# Patient Record
Sex: Female | Born: 1965
Health system: Southern US, Community
[De-identification: ages and names within clinical notes are randomized; demographics above are authoritative.]

## PROBLEM LIST (undated history)

## (undated) DIAGNOSIS — E039 Hypothyroidism, unspecified: Secondary | ICD-10-CM

## (undated) DIAGNOSIS — E119 Type 2 diabetes mellitus without complications: Secondary | ICD-10-CM

## (undated) DIAGNOSIS — E079 Disorder of thyroid, unspecified: Secondary | ICD-10-CM

## (undated) DIAGNOSIS — E785 Hyperlipidemia, unspecified: Secondary | ICD-10-CM

## (undated) DIAGNOSIS — I10 Essential (primary) hypertension: Secondary | ICD-10-CM

## (undated) HISTORY — DX: Type 2 diabetes mellitus without complications: E11.9

## (undated) HISTORY — DX: Hyperlipidemia, unspecified: E78.5

## (undated) HISTORY — DX: Hypothyroidism, unspecified: E03.9

## (undated) HISTORY — PX: ABDOMINAL HYSTERECTOMY: SHX81

## (undated) HISTORY — PX: PLANTAR FASCIA SURGERY: SHX746

## (undated) HISTORY — DX: Essential (primary) hypertension: I10

## (undated) HISTORY — DX: Disorder of thyroid, unspecified: E07.9

---

## 2001-09-21 ENCOUNTER — Other Ambulatory Visit: Admission: RE | Admit: 2001-09-21 | Discharge: 2001-09-21 | Payer: Self-pay | Admitting: Family Medicine

## 2003-09-14 ENCOUNTER — Ambulatory Visit (HOSPITAL_COMMUNITY): Admission: RE | Admit: 2003-09-14 | Discharge: 2003-09-14 | Payer: Self-pay | Admitting: Family Medicine

## 2006-06-19 ENCOUNTER — Ambulatory Visit: Payer: Self-pay | Admitting: Cardiology

## 2006-06-24 ENCOUNTER — Ambulatory Visit: Payer: Self-pay | Admitting: Cardiology

## 2006-06-25 ENCOUNTER — Ambulatory Visit: Payer: Self-pay | Admitting: Cardiovascular Disease

## 2006-06-25 ENCOUNTER — Ambulatory Visit (HOSPITAL_COMMUNITY): Admission: RE | Admit: 2006-06-25 | Discharge: 2006-06-25 | Payer: Self-pay | Admitting: Cardiology

## 2006-08-18 ENCOUNTER — Encounter: Admission: RE | Admit: 2006-08-18 | Discharge: 2006-08-18 | Payer: Self-pay | Admitting: Family Medicine

## 2006-08-26 ENCOUNTER — Ambulatory Visit (HOSPITAL_BASED_OUTPATIENT_CLINIC_OR_DEPARTMENT_OTHER): Admission: RE | Admit: 2006-08-26 | Discharge: 2006-08-26 | Payer: Self-pay | Admitting: Orthopedic Surgery

## 2008-10-17 ENCOUNTER — Ambulatory Visit: Payer: Self-pay | Admitting: Cardiovascular Disease

## 2008-10-17 ENCOUNTER — Ambulatory Visit: Payer: Self-pay

## 2008-11-08 ENCOUNTER — Encounter: Admission: RE | Admit: 2008-11-08 | Discharge: 2009-02-06 | Payer: Self-pay | Admitting: Family Medicine

## 2008-12-22 ENCOUNTER — Ambulatory Visit (HOSPITAL_COMMUNITY): Admission: RE | Admit: 2008-12-22 | Discharge: 2008-12-22 | Payer: Self-pay | Admitting: Surgery

## 2008-12-26 ENCOUNTER — Ambulatory Visit (HOSPITAL_COMMUNITY): Admission: RE | Admit: 2008-12-26 | Discharge: 2008-12-26 | Payer: Self-pay | Admitting: Surgery

## 2009-01-03 ENCOUNTER — Encounter: Admission: RE | Admit: 2009-01-03 | Discharge: 2009-04-03 | Payer: Self-pay | Admitting: Surgery

## 2009-02-13 ENCOUNTER — Ambulatory Visit (HOSPITAL_COMMUNITY): Admission: RE | Admit: 2009-02-13 | Discharge: 2009-02-13 | Payer: Self-pay | Admitting: Surgery

## 2009-02-13 HISTORY — PX: LAPAROSCOPIC GASTRIC BANDING: SHX1100

## 2009-03-01 ENCOUNTER — Encounter: Payer: Self-pay | Admitting: Cardiovascular Disease

## 2009-04-10 ENCOUNTER — Encounter: Admission: RE | Admit: 2009-04-10 | Discharge: 2009-07-09 | Payer: Self-pay | Admitting: Surgery

## 2009-08-09 ENCOUNTER — Encounter: Admission: RE | Admit: 2009-08-09 | Discharge: 2009-10-03 | Payer: Self-pay | Admitting: Surgery

## 2010-06-07 ENCOUNTER — Encounter: Admission: RE | Admit: 2010-06-07 | Discharge: 2010-06-07 | Payer: Self-pay | Admitting: Surgery

## 2011-01-14 LAB — COMPREHENSIVE METABOLIC PANEL
Albumin: 4.1 g/dL (ref 3.5–5.2)
BUN: 15 mg/dL (ref 6–23)
CO2: 26 mEq/L (ref 19–32)
Calcium: 9.6 mg/dL (ref 8.4–10.5)
Chloride: 105 mEq/L (ref 96–112)
Creatinine, Ser: 0.67 mg/dL (ref 0.4–1.2)
GFR calc Af Amer: >60 mL/min (ref >60)
Glucose, Bld: 104 mg/dL — ABNORMAL HIGH (ref 70–99)
Potassium: 4.3 mEq/L (ref 3.5–5.1)
Sodium: 139 mEq/L (ref 135–145)
Total Bilirubin: 0.6 mg/dL (ref 0.3–1.2)
Total Protein: 7.1 g/dL (ref 6.0–8.3)

## 2011-01-14 LAB — DIFFERENTIAL
Basophils Absolute: 0 10*3/uL (ref 0.0–0.1)
Basophils Relative: 1 % (ref 0–1)
Eosinophils Absolute: 0 10*3/uL (ref 0.0–0.7)
Eosinophils Relative: 0 % (ref 0–5)
Lymphocytes Relative: 36 % (ref 12–46)
Monocytes Absolute: 0.2 10*3/uL (ref 0.1–1.0)
Neutrophils Relative %: 59 % (ref 43–77)

## 2011-01-14 LAB — CBC
MCV: 81.7 fL (ref 78.0–100.0)
RDW: 13.9 % (ref 11.5–15.5)

## 2011-02-18 NOTE — Procedures (Signed)
[facility]                              EXERCISE TREADMILL   MAHASIN, RIVIERE                 MRN:          [id number]  DATE:10/17/2008                            DOB:          [birth year]    Excess stress test.   INDICATIONS:  Pre-exercise program.  The patient excised 5 minutes on  standard Bruce protocol.  Exercise was stopped due to dyspnea and  fatigue.  Maximum heart rate was 134.  The patient did take her  Lopressor prior to the study.  Maximum blood pressure was 208/86.   Resting EKG was normal with stress.  There was no ischemia or  arrhythmia.   IMPRESSION:  Negative exercise stress test at a reasonable workload.  Heart rate response somewhat suboptimal due to beta-blockade.   This would indicate that the patient is free to pursue an exercise  program without limitation.     Noralyn Pick. Eden Emms, MD, [facility]  Electronically Signed    PCN/MedQ  DD: 10/17/2008  DT: 10/18/2008  Job #: [id number]   cc:   Lillia Carmel, M.D.

## 2011-02-18 NOTE — Op Note (Signed)
Melissa Wyatt, Melissa Wyatt          ACCOUNT NO.:  [id number]   MEDICAL RECORD NO.:  [id number]          PATIENT TYPE:  AMB   LOCATION:  DAY                          FACILITY:  [facility]   PHYSICIAN:  Sandria Bales. Ezzard Standing, M.D.  DATE OF BIRTH:  [birth year]   DATE OF PROCEDURE:  02/13/2009  DATE OF DISCHARGE:                               OPERATIVE REPORT   Date of Surgery - 13 Feb 2009   PREOPERATIVE DIAGNOSIS:  Morbid obesity (weight 273), body mass index  50.4.   POSTOPERATIVE DIAGNOSIS:  Morbid obesity (weight 273), body mass index  50.4.   PROCEDURE:  Placement of an AP large laparoscopic band, port placed  subcutaneous.   SURGEON:  Sandria Bales. Ezzard Standing, MD.   FIRST ASSISTANT:  Wenda Low, MD.   ANESTHESIA:  General endotracheal with approximately 30 mL of 1%  Xylocaine.   COMPLICATIONS:  None.   INDICATIONS FOR PROCEDURE:  Melissa Wyatt is a 45 year old white female  who is a patient of Dr. Lavada Mesi and sees Dr. Charlton Haws from a  cardiac standpoint, has been through our preoperative bariatric program.  She is interested in having a lap-band for weight control.  I discussed  with her the indications and potential complications of lap-band  surgery.   Potential complications include, but are not limited to, bleeding,  infection, bowel injury, slippage of the band, erosion of the band and  long-term nutritional consequences.   OPERATIVE NOTE:  The patient placed in the supine position, given a  general endotracheal anesthetic.   She was prepped with DuraPrep and sterilely draped.  She was given 2  grams of Ancef at initiation of procedure.  A time-out was held  identifying the patient and the procedure.   I accessed the abdominal cavity in the left upper quadrant using an 11-  mm Optiview.  The right and left lobes of the liver were both yellow due  to fatty infiltration.  The stomach that I could see was unremarkable.  She did have some adhesions towards her lower  abdomen where the omentum  was.  This covered most of her bowel but I saw no other mass or nodule.   I then placed 4 additional trocars, I placed a 5-mm trocar in the  subxiphoid location for the liver retractor, a 15-mm right upper  quadrant trocar, an 11-mm right paramedian trocar, an 11-mm left  paramedian trocar.   I placed the [facility] liver retractor on the left lobe of the liver and  was able to elevate the liver.  I got to the hiatus and identified the  gastroesophageal junction.   First, I passed the sizing tube down into the stomach, blew up 15 mL  there, pulled this back.  This snugged against the hiatus and there was  no evidence of a hiatal hernia.   I then made an incision on the angle of His to the left of the  gastroesophageal junction and dissected along the left crus.   I then made an opening into the gastrohepatic ligament at the end of the  right crus, found where the fat seemed to cross the  right crus behind  the stomach, made an incision here and passed the finger dissector  behind the gastroesophageal junction up to the angle of His.   Because of the patient's size and BMI, she did have a moderate amount of  fat around her gastroesophageal junction.  I used an AP large band and  passed this into the stomach.  I passed the tubing into the finger  dissector, pulled the tubing around the gastroesophageal junction and  then readvanced the sizing tube.  I cinched the large band down on the  sizing tubing and there was adequate room, the sizing tube was then  removed and, actually, there as a little bit of space around the AP  large band around the gastroesophageal junction, so it actually looked a  little looser than they normally expected.   I then used the 0 Ethibond suture and grabbed stomach along the greater  curvature and imbricated this over the band to the stomach above the  band.  I placed 3 sutures, a 0 Ethibond suture, it imbricated the  stomach over  the band and held the band in place.   I then reinspected the band, photos were taken that will be placed in  the chart showing the band position.  I then pulled the tubing out  through the right paramedian incision.  The [facility] liver retractor  was removed, each trocar was removed in turn, there was no bleeding at  any trocar site.  I decided, because she is thick enough, to place the  port subcutaneously, so I made a pocket lateral to the incision and  placed the port there.  I did have a backing of polypropylene behind the  port and then oversewed this with a 2-0 Vicryl suture.  I then closed  each port site with a 5-0 Vicryl suture, painted the wounds with  tincture of benzoin and Steri-Strips.   The patient tolerated the procedure well, went to the recovery room in  good condition.  Sponge and needle count were correct.  The patient  possibly will go home today.      Sandria Bales. Ezzard Standing, M.D.  Electronically Signed     DHN/MEDQ  D:  02/13/2009  T:  02/13/2009  Job:  [id number]   cc:   Lillia Carmel, M.D.  Fax: [phone]   Noralyn Pick. Eden Emms, MD, [facility]  [address]  [address]  [place]  [place] [address]

## 2011-02-21 NOTE — Assessment & Plan Note (Signed)
[facility]                              CARDIOLOGY OFFICE NOTE   Melissa Wyatt, Melissa Wyatt                 MRN:          [id number]  DATE:06/19/2006                            DOB:          [birth year]    REFERRING PHYSICIAN:  Lillia Wyatt, M.D.   REASON FOR REFERRAL:  Evaluation of chest pain.   CLINICAL HISTORY:  Melissa Wyatt is 45 years old, and has been seen by me a  couple of years ago for evaluation of chest pain.  At that time we did a  stress echo, which did not show any evidence of ischemia.  Recently she has  had recurrent chest pain in the last 2 weeks that she describes as  substernal pressure.  She relates this mostly to stress.  She has not had  much in the way of exertional symptoms.  She has occasional palpitations,  and has had no major symptoms of shortness of breath.   PAST MEDICAL HISTORY:  Significant for hypertension, and Melissa Wyatt has been  treating her for this, and she is currently on metoprolol.  She also has a  history of treated hypothyroidism.   CURRENT MEDICATIONS:  1. Metoprolol 100 mg in the morning, 50 mg at noon and 100 mg at night.  2. She is also on Synthroid.   FAMILY HISTORY:  Positive in her mother.  Her father had bypass surgery, and  now has lung cancer.  Her mother also had bypass surgery, and now has  cerebral aneurysms that have been treated with coils.  Her mother is a  patient of mine.   SOCIAL HISTORY:  She is married, does not smoke, has one child.  She used to  work in Clinical biochemist, and now she is working in Airline pilot, I believe, and  she has some increased stress related to this.   REVIEW OF SYSTEMS:  Positive for headaches.   PHYSICAL EXAMINATION:  The blood pressure is 180/95, the pulse is 60 and  regular.  There was no venous distention.  The carotid pulses are full and there were  no bruits.  CHEST:  Clear without rales or rhonchi.  CARDIAC:  Rhythm was regular.  The first and  second heart sounds were  normal.  There were no murmurs or gallops.  ABDOMEN:  Soft but protuberant.  There is no organomegaly. The bowel sounds  were normal.  Peripheral pulses are full, and there is no peripheral edema.  MUSCULOSKELETAL SYSTEM:  Showed no deformity.  SKIN:  Warm and dry.  NEUROLOGIC:  Examination showed no focal neurological signs.   IMPRESSION:  1. Chest pain, atypical for ischemia.  2. Hypertension, not under good control.  3. History of hyperlipidemia.  4. Marked positive family history for coronary heart disease.   RECOMMENDATIONS:  Melissa Wyatt has a very strong risk profile, but her  symptoms are somewhat atypical, so I think her risk is low to intermediate.  She is not a very good candidate for a stress test because of the  probability of false positive test, and I think maybe the best way to  evaluate  her would be with a CT angiogram.  We have arranged for her to have  this done over at [facility]  She knows Dr. Fabio Wyatt twin brother quite  well, and I have told her Dr. Eden Wyatt would be involved in her study.  We  will also get a repeat fasting lipid profile, BNP and CBC, and we will start  her on Norvasc 10 mg a day.  She also can follow up with her blood pressure  response.   ADDENDUM:  Melissa Wyatt mother's name is Melissa Wyatt, who is a patient  of mine.  Her boss is best friends with Melissa Wyatt's brother, and Melissa Arista  Wyatt's brother was in her boss's wedding recently.                               Melissa Elvera Lennox Juanda Chance, MD, [facility]    BRB/MedQ  DD:  06/19/2006  DT:  06/21/2006  Job #:  [id number]

## 2011-02-21 NOTE — Letter (Signed)
July 01, 2006    Lillia Carmel, M.D.  [address]  [place], [place]  [address]   RE:  Melissa Wyatt, Melissa Wyatt  MRN:  [id number]  /  DOB:  [birth year]   Dear Kathlene November:   We recently did a cardiac CT angiogram on [facility] and her results  were quite good.  The coronary arteries were free of significant obstruction  and there was no calcium in the coronary arteries.  However, her blood tests  were abnormal with an elevated fasting glucose of 115 and a cholesterol of  219 with triglycerides of 178 and HDL of 38 and LDL of 147.  She also has  hypertension and has a positive family history for coronary heart disease.  Her last weight here was 262 pounds.   She indicated to me that she had been quite successful under your guidance  in loosing weight in the past and would prefer to try and loose some weight  as opposed to starting on anticholesterol treatment.  I think the diet and  weight loss are the most important thing that she can do and I supported  that.  She plans to be in touch with you about following through on her  weight reduction program.   Her CT angio showed a lung nodule and the radiologist recommended a followup  CT in 12 months.  We will plan to take care of arranging that followup.  Otherwise, I will plan to see her back on a p.r.n. basis.  I appreciate the  opportunity to help with her.   Incl:  Copy CT angio, laboratory   Sincerely,     Bruce R. Juanda Chance, MD, [facility]   BRB/MedQ  DD:  07/01/2006 DT:  07/03/2006 Job #:  [id number]

## 2011-06-05 ENCOUNTER — Telehealth (INDEPENDENT_AMBULATORY_CARE_PROVIDER_SITE_OTHER): Payer: Self-pay

## 2011-06-05 NOTE — Telephone Encounter (Signed)
Pt called and states for about 2 weeks at night she is experiencing reflux of liquid up into her throat and coughing.  She is 2 years s/p lap band placement.  She last eats or drinks at 9pm and goes to bed at 10 or 11pm.  I told her it would be worth trying to stop eating or drinking at 7pm and that may help but that her band is probably too tight.  She has already made an appointment for tomorrow with Mardelle Matte.

## 2011-06-06 ENCOUNTER — Encounter (INDEPENDENT_AMBULATORY_CARE_PROVIDER_SITE_OTHER): Payer: Self-pay

## 2011-07-03 ENCOUNTER — Encounter (INDEPENDENT_AMBULATORY_CARE_PROVIDER_SITE_OTHER): Payer: Self-pay

## 2011-07-04 ENCOUNTER — Ambulatory Visit (INDEPENDENT_AMBULATORY_CARE_PROVIDER_SITE_OTHER): Payer: Commercial Managed Care - PPO | Admitting: Physician Assistant

## 2011-07-04 ENCOUNTER — Encounter (INDEPENDENT_AMBULATORY_CARE_PROVIDER_SITE_OTHER): Payer: Self-pay | Admitting: Physician Assistant

## 2011-07-04 VITALS — BP 126/82 | HR 56 | Resp 14 | Ht 62.0 in | Wt 185.6 lb

## 2011-07-04 DIAGNOSIS — Z4651 Encounter for fitting and adjustment of gastric lap band: Secondary | ICD-10-CM

## 2011-07-04 NOTE — Progress Notes (Signed)
  HISTORY: Melissa Wyatt is a 45 y.o.female who received an AP-Large lap-band in May 2010 by Dr. Ezzard Wyatt. She comes in having last been seen in Nov 2011 and has lost a remarkable 50 lbs. Unfortunately over the past month she's begun having progressive GERD symptoms to the point that now she's not capable of taking solids or liquids. She's been taking omeprazole with little help.  VITAL SIGNS: Filed Vitals:   07/04/11 1311  BP: 126/82  Pulse: 56  Resp: 14    PHYSICAL EXAM: Physical exam reveals a very well-appearing 45 y.o.female in no apparent distress Neurologic: Awake, alert, oriented Psych: Bright affect, conversant Respiratory: Breathing even and unlabored. No stridor or wheezing Abdomen: Soft, nontender, nondistended to palpation. Incisions well-healed. No incisional hernias. Port easily palpated. Extremities: Atraumatic, good range of motion.  ASSESMENT: 45 y.o.  female  s/p AP-Large lap-band.   PLAN: [name] port was accessed with a 20G Huber needle without difficulty. Clear fluid was aspirated and 2.5 mL saline was removed from the port to give a total predicted volume of 7.5 mL. She felt no further globus sensation and was able to tolerate water with ease. I'd like her to return in two weeks to re-establish some degree of restriction without obstruction. I've asked her to take omeprazole 20 mg bid for one week followed by qday for the following week.

## 2011-07-04 NOTE — Patient Instructions (Signed)
Take omeprazole 20mg  twice a day for one week followed by once a day for one week. Return in two weeks.

## 2011-07-23 ENCOUNTER — Encounter (INDEPENDENT_AMBULATORY_CARE_PROVIDER_SITE_OTHER): Payer: Commercial Managed Care - PPO | Admitting: General Surgery

## 2011-08-01 ENCOUNTER — Encounter (INDEPENDENT_AMBULATORY_CARE_PROVIDER_SITE_OTHER): Payer: Commercial Managed Care - PPO

## 2011-08-08 ENCOUNTER — Ambulatory Visit (INDEPENDENT_AMBULATORY_CARE_PROVIDER_SITE_OTHER): Payer: Commercial Managed Care - PPO | Admitting: Physician Assistant

## 2011-08-08 ENCOUNTER — Encounter (INDEPENDENT_AMBULATORY_CARE_PROVIDER_SITE_OTHER): Payer: Self-pay

## 2011-08-08 VITALS — BP 132/86 | HR 60 | Temp 98.2°F | Resp 18 | Ht 62.0 in | Wt 197.2 lb

## 2011-08-08 DIAGNOSIS — Z4651 Encounter for fitting and adjustment of gastric lap band: Secondary | ICD-10-CM

## 2011-08-08 NOTE — Progress Notes (Signed)
  HISTORY: Melissa Wyatt is a 45 y.o.female who received an AP-Large lap-band in May 2010 by Dr. Ezzard Standing. She was last seen one month ago for persistent reflux. I removed 2 mL from her band and since then has gained 12 lbs. Her symptoms have resolved. She would like to get an upward adjustment today.  VITAL SIGNS: Filed Vitals:   08/08/11 0931  BP: 132/86  Pulse: 60  Temp: 98.2 F (36.8 C)  Resp: 18    PHYSICAL EXAM: Physical exam reveals a very well-appearing 45 y.o.female in no apparent distress Neurologic: Awake, alert, oriented Psych: Bright affect, conversant Respiratory: Breathing even and unlabored. No stridor or wheezing Abdomen: Soft, nontender, nondistended to palpation. Incisions well-healed. No incisional hernias. Port easily palpated. Extremities: Atraumatic, good range of motion.  ASSESMENT: 45 y.o.  female  s/p AP-Large lap-band.   PLAN: [name] port was accessed with a 20G Huber needle without difficulty. Clear fluid was aspirated and 2 mL saline was added to the port to give a total predicted volume of 9.5 mL which is just shy of her previous fill volume of 10 mL. The patient was able to swallow water without difficulty following the procedure and was instructed to take clear liquids for the next 24-48 hours and advance slowly as tolerated.

## 2011-08-08 NOTE — Patient Instructions (Signed)
Take clear liquids for the next 48 hours. Thin protein shakes are ok to start on Saturday evening. Call us if you have persistent vomiting or regurgitation, night cough or reflux symptoms. Return as scheduled or sooner if you notice no changes in hunger/portion sizes.   

## 2011-11-06 ENCOUNTER — Encounter (INDEPENDENT_AMBULATORY_CARE_PROVIDER_SITE_OTHER): Payer: Self-pay | Admitting: General Surgery

## 2011-12-04 ENCOUNTER — Ambulatory Visit (INDEPENDENT_AMBULATORY_CARE_PROVIDER_SITE_OTHER): Payer: Commercial Managed Care - PPO | Admitting: Physician Assistant

## 2011-12-04 ENCOUNTER — Encounter (INDEPENDENT_AMBULATORY_CARE_PROVIDER_SITE_OTHER): Payer: Self-pay

## 2011-12-04 VITALS — BP 146/88 | HR 70 | Temp 97.8°F | Resp 18 | Ht 62.0 in | Wt 195.4 lb

## 2011-12-04 DIAGNOSIS — Z4651 Encounter for fitting and adjustment of gastric lap band: Secondary | ICD-10-CM

## 2011-12-04 NOTE — Progress Notes (Signed)
  HISTORY: Melissa Wyatt is a 46 y.o.female who received an AP-Large lap-band in May 2010 by Dr. Ezzard Standing. She comes in today complaining of reflux symptoms in the morning. She's not able to take her medicines without regurgitation and discomfort. Evenings are usually fine.  VITAL SIGNS: Filed Vitals:   12/04/11 0956  BP: 146/88  Pulse: 70  Temp: 97.8 F (36.6 C)  Resp: 18    PHYSICAL EXAM: Physical exam reveals a very well-appearing 46 y.o.female in no apparent distress Neurologic: Awake, alert, oriented Psych: Bright affect, conversant Respiratory: Breathing even and unlabored. No stridor or wheezing Abdomen: Soft, nontender, nondistended to palpation. Incisions well-healed. No incisional hernias. Port easily palpated. Extremities: Atraumatic, good range of motion.  ASSESMENT: 46 y.o.  female  s/p AP-Large lap-band.   PLAN: [name] port was accessed with a 20G Huber needle without difficulty. Clear fluid was aspirated and 0.5 mL saline was removed from the port to give a total predicted volume of 9 mL.

## 2011-12-04 NOTE — Patient Instructions (Signed)
Return in 3 months or sooner if needed.

## 2012-02-26 ENCOUNTER — Ambulatory Visit (INDEPENDENT_AMBULATORY_CARE_PROVIDER_SITE_OTHER): Payer: Commercial Managed Care - PPO | Admitting: Surgery

## 2012-02-26 ENCOUNTER — Ambulatory Visit (INDEPENDENT_AMBULATORY_CARE_PROVIDER_SITE_OTHER): Payer: Commercial Managed Care - PPO | Admitting: Physician Assistant

## 2012-02-26 ENCOUNTER — Encounter (INDEPENDENT_AMBULATORY_CARE_PROVIDER_SITE_OTHER): Payer: Self-pay

## 2012-02-26 VITALS — BP 144/88 | HR 68 | Temp 96.9°F | Resp 14 | Ht 62.0 in | Wt 206.0 lb

## 2012-02-26 DIAGNOSIS — Z9884 Bariatric surgery status: Secondary | ICD-10-CM | POA: Insufficient documentation

## 2012-02-26 DIAGNOSIS — Z4651 Encounter for fitting and adjustment of gastric lap band: Secondary | ICD-10-CM

## 2012-02-26 NOTE — Progress Notes (Signed)
  HISTORY: Melissa Wyatt is a 46 y.o.female who received an AP-Large lap-band in May 2010 by Dr. Ezzard Standing. She comes in with increased hunger and portion sizes since removal of fluid about three months ago. She has no further issues with reflux. She said she would like to try the same fill volume again with greater attention to eating habits.  VITAL SIGNS: Filed Vitals:   02/26/12 0852  BP: 144/88  Pulse: 68  Temp: 96.9 F (36.1 C)  Resp: 14    PHYSICAL EXAM: Physical exam reveals a very well-appearing 46 y.o.female in no apparent distress Neurologic: Awake, alert, oriented Psych: Bright affect, conversant Respiratory: Breathing even and unlabored. No stridor or wheezing Abdomen: Soft, nontender, nondistended to palpation. Incisions well-healed. No incisional hernias. Port easily palpated. Extremities: Atraumatic, good range of motion.  ASSESMENT: 46 y.o.  female  s/p AP-Large lap-band.   PLAN: [name] port was accessed with a 20G Huber needle without difficulty. Clear fluid was aspirated and 0.5 mL saline was added to the port to give a total predicted volume of 9.5 mL. The patient was able to swallow water without difficulty following the procedure and was instructed to take clear liquids for the next 24-48 hours and advance slowly as tolerated.

## 2012-02-26 NOTE — Patient Instructions (Signed)
Take clear liquids tonight. Thin protein shakes are ok to start tomorrow morning. Slowly advance your diet thereafter. Call us if you have persistent vomiting or regurgitation, night cough or reflux symptoms. Return as scheduled or sooner if you notice no changes in hunger/portion sizes.  

## 2012-02-26 NOTE — Progress Notes (Signed)
The patient saw Mardelle Matte earlier today who added 0.5 cc to her lap band.  The calculated amount in the lap band was 9.5 cc.  But she got sick within one hour of leaving the office and comes back because the lap band is too tight.  I withdrew all the fluid and only measured 8.0 cc.  I removed 0.5 cc and the patient was able to tolerate water.  She will see [place] back in about 6 months.  Ovidio Kin, MD, [facility] Pager: [phone] Office phone:  [phone]

## 2012-04-02 ENCOUNTER — Telehealth (INDEPENDENT_AMBULATORY_CARE_PROVIDER_SITE_OTHER): Payer: Self-pay

## 2012-04-02 NOTE — Telephone Encounter (Signed)
I called the pt back because I got a message from Dr Ezzard Standing and he said she could see Dr Biagio Quint or him.  I told her I would have the bariatric coordinator involved with the process and plan to make an appointment with Dr Biagio Quint since he does the surgery.  I will have them call her.

## 2012-04-02 NOTE — Telephone Encounter (Signed)
The patient called regarding her lap band and wanting to switch to a sleeve.  She has lost about 80 lbs and was told her band had the max amount of fluid in it.  She vomits occasionally and is nauseated frequently.  She thinks the sleeve will be better for her and she won't have to deal with those symptoms.  I told her Dr Ezzard Standing doesn't do that kind of surgery but there is another surgeon here that does.  I said I would ask if Dr Ezzard Standing wants to see her and discuss her band more or if she should go ahead and see the other surgeon now.  She feels like we have done all we can with the band.

## 2012-04-29 ENCOUNTER — Ambulatory Visit (INDEPENDENT_AMBULATORY_CARE_PROVIDER_SITE_OTHER): Payer: Commercial Managed Care - PPO | Admitting: General Surgery

## 2012-04-29 ENCOUNTER — Encounter (INDEPENDENT_AMBULATORY_CARE_PROVIDER_SITE_OTHER): Payer: Self-pay | Admitting: General Surgery

## 2012-04-29 VITALS — BP 122/78 | HR 68 | Temp 97.1°F | Resp 16 | Ht 62.0 in | Wt 208.2 lb

## 2012-04-29 DIAGNOSIS — R1310 Dysphagia, unspecified: Secondary | ICD-10-CM

## 2012-04-29 NOTE — Progress Notes (Signed)
Patient ID: Melissa Wyatt, female   DOB: 1966-08-16, 46 y.o.   MRN: [id number]  Chief Complaint  Patient presents with  . Weight Loss Surgery    Discuss conversion from Lap Band to Gastric Sleeve    HPI Melissa Wyatt is a 46 y.o. female.  This patient is presents for evaluation and discussion of possible conversion of a lap band to  sleeve gastrectomy. She had a LAP-BAND placement by Dr. Ezzard Standing in May of 2010 and has lost about 70 pounds since her surgery. However, over the last few months she feels that she has been tight and her last adjustment was actually some removal of 1.5 cc of fluid. She says that even after removal she still has reflux and regurgitation. She vomits about 4 days of the week. She says that she takes in a small amount of food in her exercise is limited. She says that she does number about once per week and some golf but otherwise does not have dedicated exercise time. She was interested in a sleeve gastrectomy for additional weight loss. However, now that I talked to her and that she has read our consent form and she realizes that this procedure is not reversible and that the stomach is removed, she is no longer interested in the procedure. HPI  Past Medical History  Diagnosis Date  . Hypertension   . Thyroid disease     Past Surgical History  Procedure Date  . Laparoscopic gastric banding 02/13/09  . Abdominal hysterectomy   . Plantar fascia surgery     Family History  Problem Relation Age of Onset  . Lung cancer Father     Social History History  Substance Use Topics  . Smoking status: Never Smoker   . Smokeless tobacco: Never Used  . Alcohol Use: No    Allergies  Allergen Reactions  . Vicodin (Hydrocodone-Acetaminophen) Nausea And Vomiting and Other (See Comments)    Makes patient break out into a cold sweat.    Current Outpatient Prescriptions  Medication Sig Dispense Refill  . amLODipine (NORVASC) 5 MG tablet Take 5 mg by mouth daily.         Marland Kitchen levothyroxine (SYNTHROID, LEVOTHROID) 125 MCG tablet Take 125 mcg by mouth daily.        . metoprolol (TOPROL-XL) 100 MG 24 hr tablet Take 100 mg by mouth 2 (two) times daily.         Review of Systems Review of Systems  Blood pressure 122/78, pulse 68, temperature 97.1 F (36.2 C), temperature source Temporal, resp. rate 16, height 5\' 2"  (1.575 m), weight 208 lb 3.2 oz (94.439 kg).  Physical Exam Physical Exam  Data Reviewed   Assessment    Morbid obesity and vomiting and reflux Initially she was interested in conversion of her lap band to a sleeve gastrectomy, but she does not seem interested in this anymore after realizing that this is not a reversible procedure. I am not sure that she is quite ready for this anyway. I'm not sure that we have really maximized the use of her band. It sounds as though she is tight with her frequent regurgitation and reflux and I have recommended an upper GI for further evaluation. I would consider removal of some additional fluids and give her a holiday and then slowly increase her fluid in her bands and work her back up to where she is losing weight. We did discuss that this is extra risk since this would be a revisional procedure and  again she is not really interested in this after all.  We will check an upper GI and she will follow up with Dr. Ezzard Standing for continued band maintenance. . Was spent counseling this patient    Plan    Upper GI and followup in about 2-3 weeks with Dr. Ezzard Standing for band maintenance       [name] DAVID 04/29/2012, 4:44 PM

## 2012-05-12 ENCOUNTER — Other Ambulatory Visit: Payer: Self-pay

## 2012-05-21 ENCOUNTER — Encounter (INDEPENDENT_AMBULATORY_CARE_PROVIDER_SITE_OTHER): Payer: Commercial Managed Care - PPO | Admitting: Surgery

## 2013-03-17 ENCOUNTER — Encounter (INDEPENDENT_AMBULATORY_CARE_PROVIDER_SITE_OTHER): Payer: Self-pay

## 2013-03-17 ENCOUNTER — Ambulatory Visit (INDEPENDENT_AMBULATORY_CARE_PROVIDER_SITE_OTHER): Payer: Commercial Managed Care - PPO | Admitting: Physician Assistant

## 2013-03-17 ENCOUNTER — Telehealth (INDEPENDENT_AMBULATORY_CARE_PROVIDER_SITE_OTHER): Payer: Self-pay

## 2013-03-17 VITALS — BP 142/102 | HR 88 | Temp 97.1°F | Resp 14 | Ht 62.0 in | Wt 240.0 lb

## 2013-03-17 DIAGNOSIS — Z4651 Encounter for fitting and adjustment of gastric lap band: Secondary | ICD-10-CM

## 2013-03-17 NOTE — Patient Instructions (Signed)
Take clear liquids tonight. Thin protein shakes are ok to start tomorrow morning. Slowly advance your diet thereafter. Call us if you have persistent vomiting or regurgitation, night cough or reflux symptoms. Return as scheduled or sooner if you notice no changes in hunger/portion sizes.  

## 2013-03-17 NOTE — Telephone Encounter (Signed)
Appointment with Dr. Ezzard Standing 03/31/13@430  Patient aware

## 2013-03-17 NOTE — Progress Notes (Signed)
  HISTORY: Melissa Wyatt is a 47 y.o.female who received an AP-Large lap-band in May 2010 by Dr. Ezzard Standing. She comes in with 31 lbs weight gain since her last visit 11 months ago. She had seen Dr. Ezzard Standing in May 2013 for removal of about 0.5 mL after a fill due to dysphagia. She then saw Dr. Biagio Quint for consideration of sleeve gastrectomy but decided against the procedure. She complains of progressive increase in hunger, portion size and weight but no issues with regurgitation. She would like a fill today to get the weight back off. She is having trouble with hypertension as well, and is seeing her PCP today for this as well as bilateral pedal edema.  VITAL SIGNS: Filed Vitals:   03/17/13 0857  BP: 142/102  Pulse: 88  Temp: 97.1 F (36.2 C)  Resp: 14    PHYSICAL EXAM: Physical exam reveals a very well-appearing 47 y.o.female in no apparent distress Neurologic: Awake, alert, oriented Psych: Bright affect, conversant Respiratory: Breathing even and unlabored. No stridor or wheezing Abdomen: Soft, nontender, nondistended to palpation. Incisions well-healed. No incisional hernias. Port easily palpated. Extremities: Atraumatic, good range of motion. Bilateral pedal edema  ASSESMENT: 47 y.o.  female  s/p AP-Large lap-band.   PLAN: [name] port was accessed with a 20G Huber needle without difficulty. Only 5 mL clear fluid was aspirated, when at least 7.5 mL was predicted. I repositioned the needle to assure good access but there was not change in fluid volume. 1 mL saline was added to the port to give a total predicted volume of 6 mL. The patient was able to swallow water without difficulty following the procedure and was instructed to take clear liquids for the next 24-48 hours and advance slowly as tolerated. I'm concerned for slow leak which is uncommon with this series of band. I've asked her to see Dr. Ezzard Standing next for re-evaluation. She voiced understanding and agreement.

## 2013-03-31 ENCOUNTER — Ambulatory Visit (INDEPENDENT_AMBULATORY_CARE_PROVIDER_SITE_OTHER): Payer: Commercial Managed Care - PPO | Admitting: Surgery

## 2013-03-31 VITALS — BP 132/78 | HR 68 | Temp 98.0°F | Resp 18 | Ht 62.0 in | Wt 243.0 lb

## 2013-03-31 DIAGNOSIS — Z9884 Bariatric surgery status: Secondary | ICD-10-CM

## 2013-03-31 NOTE — Progress Notes (Signed)
[facility]  Ovidio Kin, MD,  FACS [address]  [address] [place], [place]    [address] Phone:  [phone] FAX:  [phone]   Re:   Melissa Wyatt DOB:   12-27-1965 MRN:   [id number]  ASSESSMENT AND PLAN: 1.  Lap band, AP large  Placed by Melissa Wyatt - 02/13/2009  Initial weight - 276, BMI - 50.8  I filled her lap band to a total of 7.5 cc.  Melissa Wyatt had raised the question of a leak.  We will see how she does with this amount.  Her follow up will be in 3 months. 2.  Hypertension 3.  Hypothyroidism 4.  Hypercholsterolemia.  HISTORY OF PRESENT ILLNESS: Chief Complaint  Patient presents with  . Lap Band Fill    ? leak   Melissa Wyatt is a 47 y.o. (DOB: 1965/11/22)  white  female who is a patient of Melissa J, MD and comes to me today for lap band evaluation.  She saw Melissa Wyatt 04/29/2012 for consideration of converting from a lap band to a sleeve gastrectomy, but decided against it.  She did not come back for follow up until 03/17/2013, when she saw Melissa Wyatt who added fluid. He raised the concern about a "slow leak".  In the intervening year (2013 to 2014), she has gained almost 40 pounds. We talked about the cost of the fills, which her insurance does not cover now.  We talked about trying to get her to a good resistance level with her lap band. She also mentioned a friend, Melissa Wyatt, converted from a lap band to a sleeve in [place] (she thinks).  PHYSICAL EXAM: BP 132/78  Pulse 68  Temp(Src) 98 F (36.7 C)  Resp 18  Ht 5\' 2"  (1.575 m)  Wt 243 lb (110.224 kg)  BMI 44.43 kg/m2  Abdomen:  Soft.  BS present.  The lap band port seems in the correct location.  Procedure:  I accessed her port and there was less fluid than I would have expected.  I measured 4.5 cc, I added 3.0 cc to get her to 7.5 cc.  She drank water and it went down slowly.  She is to stay on liquids for 2 days.  Because of her recent fill by Melissa Wyatt, I did not charge  her for today.  DATA REVIEWED: Epic notes   Ovidio Kin, MD, FACS Office:  [phone]

## 2013-08-25 ENCOUNTER — Telehealth: Payer: Self-pay | Admitting: *Deleted

## 2013-08-25 NOTE — Telephone Encounter (Signed)
LEFT  MESSAGE  WITH ANITA  AT  DR  [facility] OFFICE HAVE NEVER  SEEN PT . UNABLE TO  SEND LAST OFFICE NOTE/CY

## 2014-11-09 ENCOUNTER — Other Ambulatory Visit (INDEPENDENT_AMBULATORY_CARE_PROVIDER_SITE_OTHER): Payer: Self-pay

## 2014-11-09 DIAGNOSIS — IMO0001 Reserved for inherently not codable concepts without codable children: Secondary | ICD-10-CM

## 2014-11-09 DIAGNOSIS — T85518A Breakdown (mechanical) of other gastrointestinal prosthetic devices, implants and grafts, initial encounter: Principal | ICD-10-CM

## 2014-11-10 ENCOUNTER — Ambulatory Visit (HOSPITAL_COMMUNITY)
Admission: RE | Admit: 2014-11-10 | Discharge: 2014-11-10 | Disposition: A | Discharge status: Home / Self Care | Payer: Commercial Managed Care - PPO | Source: Ambulatory Visit | Attending: Surgery | Admitting: Surgery

## 2014-11-10 ENCOUNTER — Encounter (INDEPENDENT_AMBULATORY_CARE_PROVIDER_SITE_OTHER): Payer: Self-pay

## 2014-11-10 DIAGNOSIS — K9589 Other complications of other bariatric procedure: Secondary | ICD-10-CM | POA: Insufficient documentation

## 2014-11-15 ENCOUNTER — Other Ambulatory Visit (INDEPENDENT_AMBULATORY_CARE_PROVIDER_SITE_OTHER): Payer: Self-pay | Admitting: Surgery

## 2015-06-05 ENCOUNTER — Encounter: Payer: Self-pay | Admitting: Cardiovascular Disease

## 2015-06-05 ENCOUNTER — Ambulatory Visit (INDEPENDENT_AMBULATORY_CARE_PROVIDER_SITE_OTHER): Payer: Commercial Managed Care - PPO | Admitting: Cardiovascular Disease

## 2015-06-05 VITALS — BP 146/84 | HR 64 | Ht 62.0 in | Wt 283.8 lb

## 2015-06-05 DIAGNOSIS — I10 Essential (primary) hypertension: Secondary | ICD-10-CM | POA: Diagnosis not present

## 2015-06-05 DIAGNOSIS — R0602 Shortness of breath: Secondary | ICD-10-CM

## 2015-06-05 NOTE — Progress Notes (Signed)
Patient ID: Melissa Wyatt, female   DOB: 30-Jul-1966, 49 y.o.   MRN: [id number]     Cardiology Office Note   Date:  06/05/2015   ID:  SHERMA VANMETRE, DOB 1966/04/30, MRN [id number]  PCP:  Hortencia Pilar, MD  Cardiologist:   Jenkins Rouge, MD   No chief complaint on file.     History of Present Illness: Melissa Wyatt is a 49 y.o. female who presents for  Evaluation of dyspnea and exertional chest pain.  CRF HTN: and elevated lipids   Had normal ETT in 2012 and normal cardiac CT in 2007 with left dominant cors and calcium score of 0   Had lapband surgery in 2012    Seen by [facility] 8/17  Complained of dyspnea with exertion , diaphoresis.  Also with nausea.  Wakes up with HA.  Symptoms for 2 months  She is scared to exercise BP high and losartan increased and amlodipine added  Reviwe of labs showed LDL 130    Her Omron BP cuff is totally inaccurate.  In office with our cuff BP was 150/85 and hers read 228/117.  Her arms are large and I suspect She would be better off with wrist cuff   2007:  Cardiac CTA reviewed    IMPRESSION: 1. Calcium score equal to 0. 2. Normal LV size and function. Quantitative ejection fraction 72%. 3. Normal left dominant coronary artery with no critical epicardial stenosis. 4. 3-mm left lower lobe lung nodule. If the patient is a smoker, suggest possible follow-up CT scan in 12 months.    Past Medical History  Diagnosis Date  . Hypertension   . Thyroid disease     Past Surgical History  Procedure Laterality Date  . Laparoscopic gastric banding  02/13/09  . Abdominal hysterectomy    . Plantar fascia surgery       Current Outpatient Prescriptions  Medication Sig Dispense Refill  . amLODipine (NORVASC) 5 MG tablet Take 5 mg by mouth daily.  5  . esomeprazole (NEXIUM) 40 MG capsule Take 40 mg by mouth 2 (two) times daily.  3  . levothyroxine (SYNTHROID, LEVOTHROID) 125 MCG tablet Take 125 mcg by mouth daily.        Marland Kitchen losartan (COZAAR) 100 MG tablet Take 100 mg by mouth daily.  5  . metoprolol (LOPRESSOR) 100 MG tablet Take 100 mg by mouth 3 (three) times daily.  1   No current facility-administered medications for this visit.    Allergies:   Vicodin    Social History:  The patient  reports that she has never smoked. She has never used smokeless tobacco. She reports that she does not drink alcohol or use illicit drugs.   Family History:  [name] family history includes Lung cancer in her father.    ROS:  Please see the history of present illness.   Otherwise, review of systems are positive for none.   All other systems are reviewed and negative.    PHYSICAL EXAM: VS:  BP 146/84 mmHg  Pulse 64  Ht 5\' 2"  (1.575 m)  Wt 128.731 kg (283 lb 12.8 oz)  BMI 51.89 kg/m2 , BMI Body mass index is 51.89 kg/(m^2). Affect appropriate Healthy:  appears stated age 84: normal Neck supple with no adenopathy JVP normal no bruits no thyromegaly Lungs clear with no wheezing and good diaphragmatic motion Heart:  S1/S2 no murmur, no rub, gallop or click PMI normal Abdomen: benighn, BS positve, no tenderness, no AAA no  bruit.  No HSM or HJR Distal pulses intact with no bruits No edema Neuro non-focal Skin warm and dry No muscular weakness    EKG:   05/23/15  SR rate 60  Nonspecific anterior T wave changes    Recent Labs: No results found for requested labs within last 365 days.    Lipid Panel No results found for: CHOL, TRIG, HDL, CHOLHDL, VLDL, LDLCALC, LDLDIRECT    Wt Readings from Last 3 Encounters:  06/05/15 128.731 kg (283 lb 12.8 oz)  03/31/13 110.224 kg (243 lb)  03/17/13 108.863 kg (240 lb)      Other studies Reviewed: Additional studies/ records that were reviewed today include:  2007/2012 records stress test and CTA.    ASSESSMENT AND PLAN:  1.  Chest Pain.  Likely GI from failed bariatric surgery f/u exercise myovue ECG with anterolateral T wave changes 2. Dyspnea:   Functional likely from weight gain.  F/U echo for RV/LV EF 3. Obesity:  Needs revision and or gastric sleeve but insurance will not cover and too expensive.  Hopefully if heart  Tests normal she can start exercise program 4. HTN:  Improved continue current meds f/u primary home BP cuff inaccurate consider wrist cuff 5. Thyroid  Continue replacement f/u TSH [facility] 6. GERD:  Continue nexium and low carb diet f/u GI   Current medicines are reviewed at length with the patient today.  The patient does not have concerns regarding medicines.  The following changes have been made:  no change  Labs/ tests ordered today include:  Exercise myovue and echo  No orders of the defined types were placed in this encounter.     Disposition:   FU with me PRN      Signed, Jenkins Rouge, MD  06/05/2015 3:11 PM    [facility] [place], [place], [place]  [address] Phone: [phone]; Fax: [phone]

## 2015-06-05 NOTE — Patient Instructions (Signed)
Medication Instructions:  NO CHANGES   Labwork: NONE  Testing/Procedures: Your physician has requested that you have an echocardiogram. Echocardiography is a painless test that uses sound waves to create images of your heart. It provides your doctor with information about the size and shape of your heart and how well your heart's chambers and valves are working. This procedure takes approximately one hour. There are no restrictions for this procedure.   Your physician has requested that you have en exercise stress myoview. For further information please visit [email or web address]. Please follow instruction sheet, as given.   Follow-Up: AS NEEDED  Any Other Special Instructions Will Be Listed Below (If Applicable).   

## 2015-06-13 ENCOUNTER — Telehealth (HOSPITAL_COMMUNITY): Payer: Self-pay | Admitting: *Deleted

## 2015-06-13 NOTE — Telephone Encounter (Signed)
Patient given detailed instructions per Myocardial Perfusion Study Information Sheet for test on 9/12 16 at 8:30. Patient notified to arrive 15 minutes early and that it is imperative to arrive on time for appointment to keep from having the test rescheduled.  If you need to cancel or reschedule your appointment, please call the office within 24 hours of your appointment. Failure to do so may result in a cancellation of your appointment, and a $50 no show fee. Patient verbalized understanding. Hubbard Robinson, RN

## 2015-06-18 ENCOUNTER — Encounter (HOSPITAL_COMMUNITY): Payer: Self-pay

## 2015-06-18 ENCOUNTER — Ambulatory Visit (HOSPITAL_COMMUNITY): Payer: Commercial Managed Care - PPO | Attending: Cardiovascular Disease

## 2015-06-18 ENCOUNTER — Other Ambulatory Visit: Payer: Self-pay

## 2015-06-18 ENCOUNTER — Ambulatory Visit (HOSPITAL_BASED_OUTPATIENT_CLINIC_OR_DEPARTMENT_OTHER): Payer: Commercial Managed Care - PPO

## 2015-06-18 ENCOUNTER — Ambulatory Visit (HOSPITAL_COMMUNITY): Payer: Commercial Managed Care - PPO

## 2015-06-18 DIAGNOSIS — R110 Nausea: Secondary | ICD-10-CM | POA: Diagnosis not present

## 2015-06-18 DIAGNOSIS — R079 Chest pain, unspecified: Secondary | ICD-10-CM | POA: Insufficient documentation

## 2015-06-18 DIAGNOSIS — I10 Essential (primary) hypertension: Secondary | ICD-10-CM | POA: Insufficient documentation

## 2015-06-18 DIAGNOSIS — I5189 Other ill-defined heart diseases: Secondary | ICD-10-CM | POA: Diagnosis not present

## 2015-06-18 DIAGNOSIS — R0602 Shortness of breath: Secondary | ICD-10-CM | POA: Diagnosis present

## 2015-06-18 DIAGNOSIS — R002 Palpitations: Secondary | ICD-10-CM | POA: Diagnosis not present

## 2015-06-18 MED ORDER — PERFLUTREN LIPID MICROSPHERE
1.0000 mL | INTRAVENOUS | Status: AC | PRN
  Administered 2015-06-18: 1 mL via INTRAVENOUS

## 2015-06-18 MED ORDER — TECHNETIUM TC 99M SESTAMIBI GENERIC - CARDIOLITE
31.1000 | Freq: Once | INTRAVENOUS | Status: AC | PRN
  Administered 2015-06-18: 31.1 via INTRAVENOUS

## 2015-06-18 NOTE — Progress Notes (Signed)
Melissa Wyatt (DOB 02-01-1966) was given  Definity 1 mL IVP for Echo, tolerated well. No complaints. The patient was transferred to Nuclear Department for a scheduled Stress Cardiolite. Post Definity 30 minute vital signs were BP 128/102, HR 75, O2 Sat 96% RA. Repeat Vital signs after walking the treadmill were BP 173/89, HR 76, O2 sat 96% RA.  The patient was discharged in stable conditiion without complaints.Oletta Lamas, Nitika Jackowski A

## 2015-06-19 ENCOUNTER — Ambulatory Visit (HOSPITAL_BASED_OUTPATIENT_CLINIC_OR_DEPARTMENT_OTHER): Payer: Commercial Managed Care - PPO

## 2015-06-19 DIAGNOSIS — R0989 Other specified symptoms and signs involving the circulatory and respiratory systems: Secondary | ICD-10-CM

## 2015-06-19 LAB — MYOCARDIAL PERFUSION IMAGING
CHL CUP NUCLEAR SDS: 1
CHL CUP NUCLEAR SRS: 1
CHL CUP RESTING HR STRESS: 75 {beats}/min
CSEPED: 5 min
CSEPEDS: 0 s
Estimated workload: 7 METS
LV dias vol: 97 mL
LV sys vol: 36 mL
MPHR: 171 {beats}/min
Peak HR: 173 {beats}/min
Percent HR: 101 %
RATE: 0.26
SSS: 2
TID: 0.81

## 2015-06-19 MED ORDER — TECHNETIUM TC 99M SESTAMIBI GENERIC - CARDIOLITE
32.5000 | Freq: Once | INTRAVENOUS | Status: AC | PRN
  Administered 2015-06-19: 33 via INTRAVENOUS

## 2015-06-21 ENCOUNTER — Other Ambulatory Visit: Payer: Self-pay | Admitting: *Deleted

## 2015-06-21 MED ORDER — AMLODIPINE BESYLATE 5 MG PO TABS: 10.0000 mg | ORAL_TABLET | Freq: Every day | ORAL | Status: DC

## 2016-02-17 IMAGING — NM NM MYOCAR MULTI W/ SPECT
3 series · 18 of 18 positions shown · non-contrast
Comparison: none

[Series 1: rest_(id)_sa · 6.5mm · 6.51mm/px · 6 of 64 frames shown]
[frame 6/64]
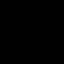
[frame 16/64]
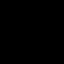
[frame 27/64]
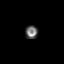
[frame 38/64]
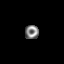
[frame 48/64]
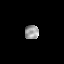
[frame 59/64]
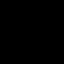

[Series 1: stress_(id)_sa · 6.5mm · 6.51mm/px · 6 of 64 frames shown (1 of 2)]
[frame 6/64]
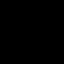
[frame 16/64]
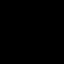
[frame 27/64]
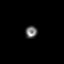
[frame 38/64]
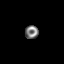
[frame 48/64]
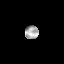
[frame 59/64]
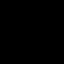

[Series 1: stress_(id)_sa · 6.5mm · 6.51mm/px · 6 of 512 frames shown (2 of 2)]
[frame 43/512]
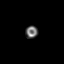
[frame 128/512]
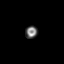
[frame 214/512]
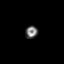
[frame 299/512]
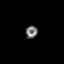
[frame 384/512]
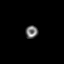
[frame 470/512]
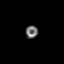

[18 of 18 positions shown; findings below may reference images not displayed]

Canned report from images found in remote index.

Refer to host system for actual result text.

## 2016-04-13 IMAGING — DX DG ABDOMEN 1V
2 series · 2 of 2 positions shown · non-contrast
Comparison: 06/07/2010

CLINICAL DATA: Having trouble with her tubing and band after
bariatric surgery. Patient stated when the doctor injected saline to
test it it would not allow the intake of the fluid.

EXAM:
ABDOMEN - 1 VIEW

[abdomen supine (1 of 2)]
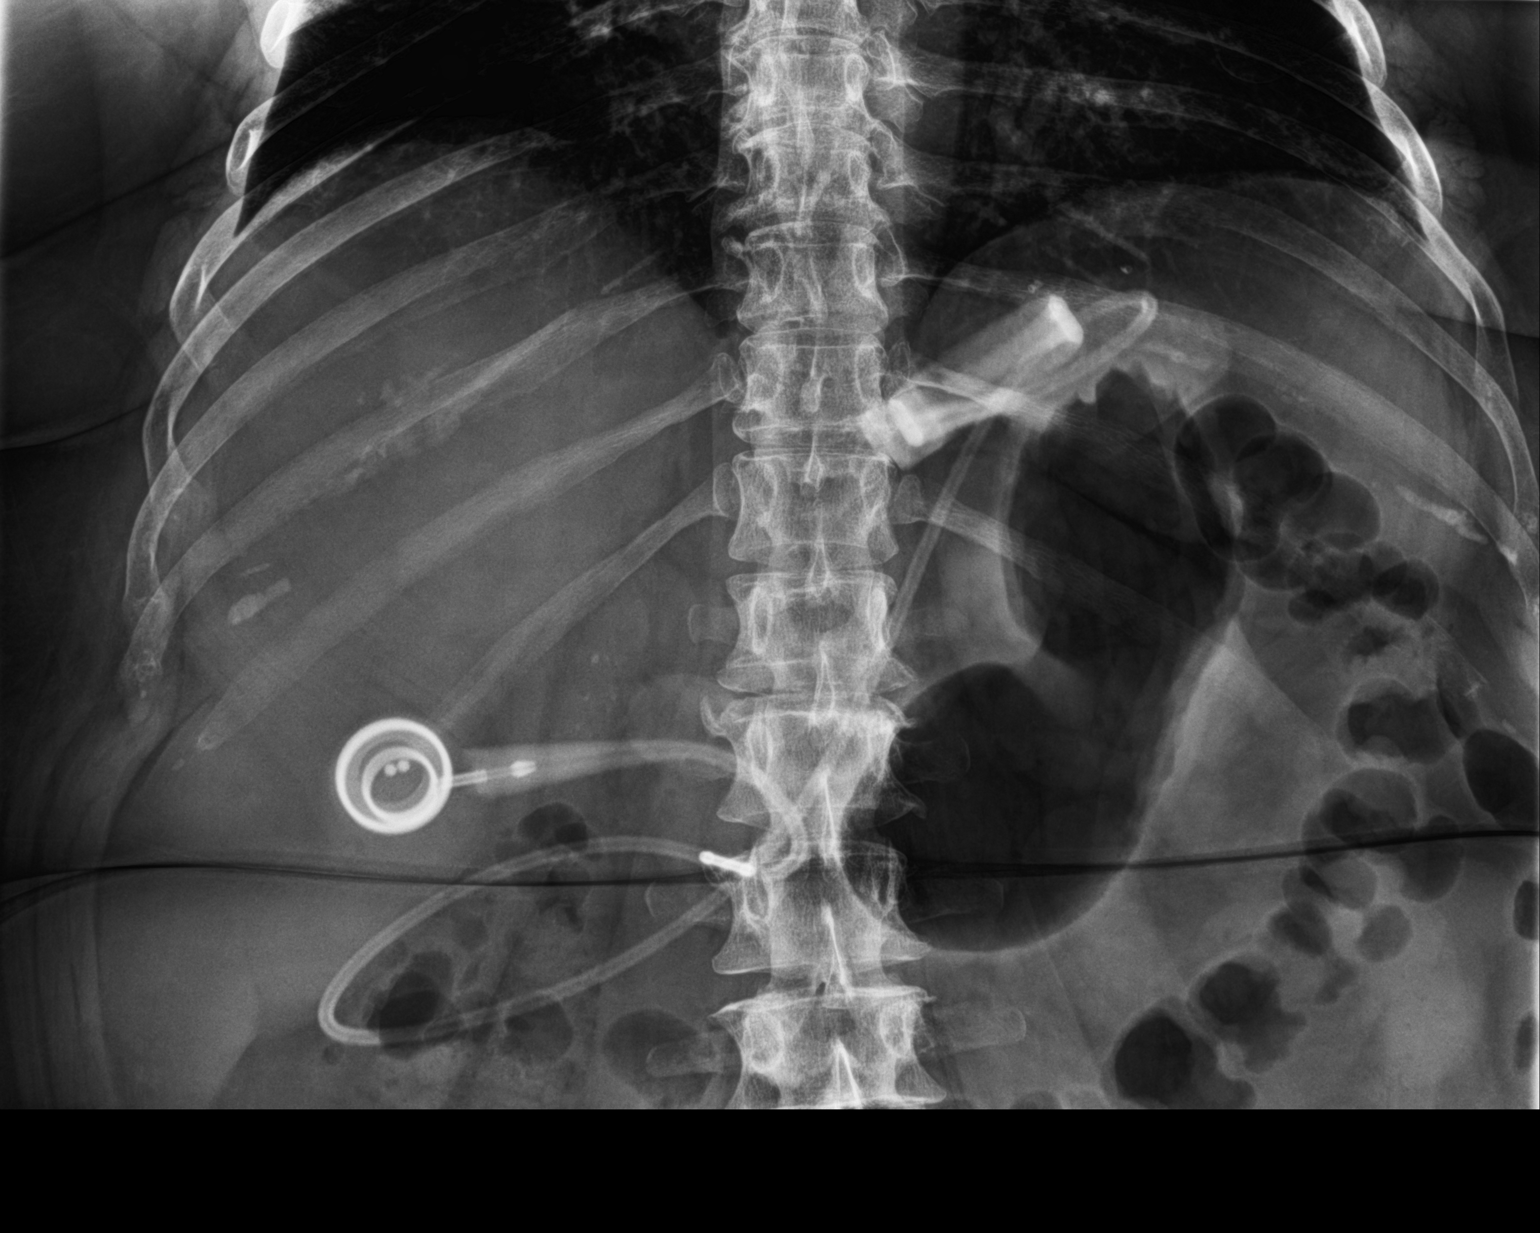

[abdomen supine (2 of 2)]
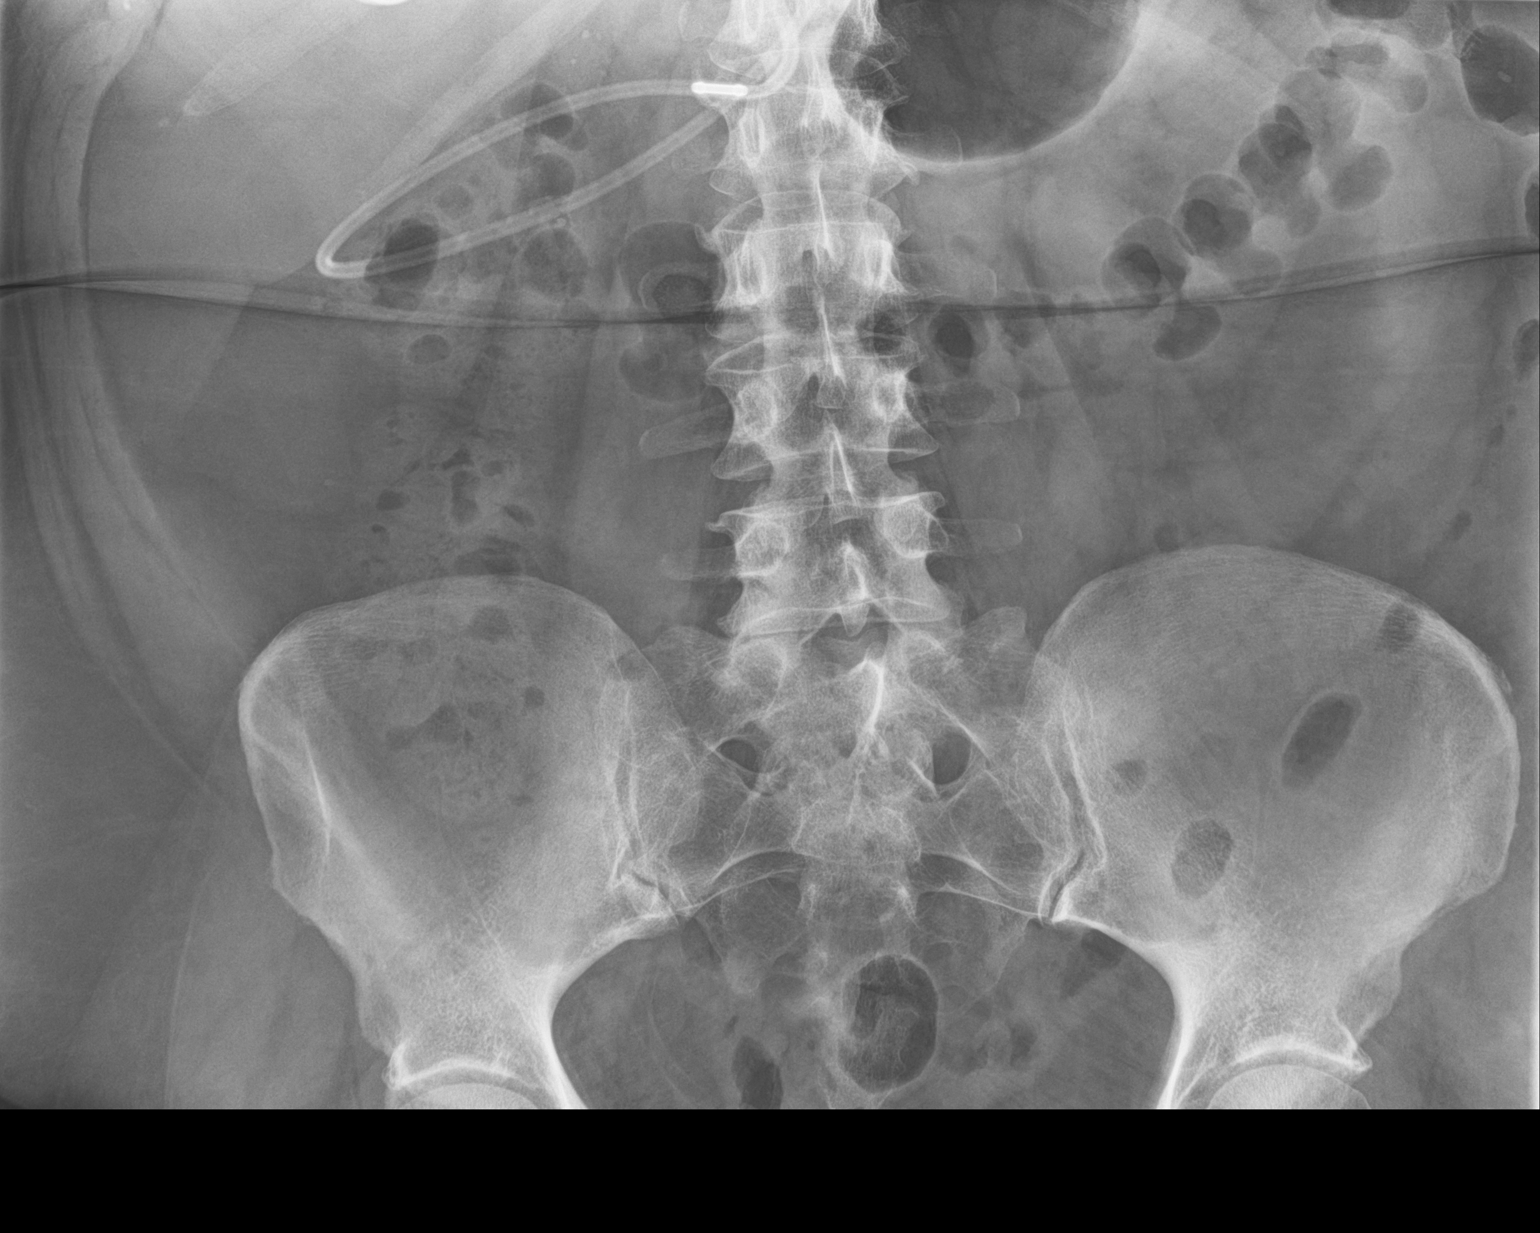

[2 of 2 positions shown; findings below may reference images not displayed]

FINDINGS: Bowel gas pattern is nonobstructive. Lap band is identified in the
left upper quadrant in the expected orientation, unchanged in
appearance. Tubing appears intact with port overlying the right
upper quadrant.

Faint calcifications overlie the region of the duodenum,
gallbladder, or pancreatic head. Visualized osseous structures have
a normal appearance.
IMPRESSION: Nonobstructive bowel gas pattern.

Lap band in the expected orientation.

## 2016-09-16 ENCOUNTER — Encounter (HOSPITAL_COMMUNITY): Payer: Self-pay

## 2017-09-17 ENCOUNTER — Encounter (HOSPITAL_COMMUNITY): Payer: Self-pay

## 2018-10-14 DIAGNOSIS — I10 Essential (primary) hypertension: Secondary | ICD-10-CM | POA: Diagnosis not present

## 2018-10-14 DIAGNOSIS — E119 Type 2 diabetes mellitus without complications: Secondary | ICD-10-CM | POA: Diagnosis not present

## 2018-10-14 DIAGNOSIS — Z9884 Bariatric surgery status: Secondary | ICD-10-CM | POA: Diagnosis not present

## 2018-10-25 DIAGNOSIS — Z713 Dietary counseling and surveillance: Secondary | ICD-10-CM | POA: Diagnosis not present

## 2018-10-25 DIAGNOSIS — Z6841 Body Mass Index (BMI) 40.0 and over, adult: Secondary | ICD-10-CM | POA: Diagnosis not present

## 2018-11-01 DIAGNOSIS — Z7189 Other specified counseling: Secondary | ICD-10-CM | POA: Diagnosis not present

## 2018-11-10 DIAGNOSIS — I10 Essential (primary) hypertension: Secondary | ICD-10-CM | POA: Diagnosis not present

## 2018-11-10 DIAGNOSIS — E1169 Type 2 diabetes mellitus with other specified complication: Secondary | ICD-10-CM | POA: Diagnosis not present

## 2018-11-15 DIAGNOSIS — Z6841 Body Mass Index (BMI) 40.0 and over, adult: Secondary | ICD-10-CM | POA: Diagnosis not present

## 2018-11-15 DIAGNOSIS — Z713 Dietary counseling and surveillance: Secondary | ICD-10-CM | POA: Diagnosis not present

## 2018-11-17 DIAGNOSIS — E119 Type 2 diabetes mellitus without complications: Secondary | ICD-10-CM | POA: Diagnosis not present

## 2018-11-17 DIAGNOSIS — Z6841 Body Mass Index (BMI) 40.0 and over, adult: Secondary | ICD-10-CM | POA: Diagnosis not present

## 2018-11-17 DIAGNOSIS — I10 Essential (primary) hypertension: Secondary | ICD-10-CM | POA: Diagnosis not present

## 2018-11-17 DIAGNOSIS — Z9884 Bariatric surgery status: Secondary | ICD-10-CM | POA: Diagnosis not present

## 2018-11-22 DIAGNOSIS — Z6841 Body Mass Index (BMI) 40.0 and over, adult: Secondary | ICD-10-CM | POA: Diagnosis not present

## 2018-11-22 DIAGNOSIS — K2970 Gastritis, unspecified, without bleeding: Secondary | ICD-10-CM | POA: Diagnosis not present

## 2018-11-22 DIAGNOSIS — K219 Gastro-esophageal reflux disease without esophagitis: Secondary | ICD-10-CM | POA: Diagnosis not present

## 2018-11-22 DIAGNOSIS — E118 Type 2 diabetes mellitus with unspecified complications: Secondary | ICD-10-CM | POA: Diagnosis not present

## 2018-11-22 DIAGNOSIS — I10 Essential (primary) hypertension: Secondary | ICD-10-CM | POA: Diagnosis not present

## 2018-11-22 DIAGNOSIS — Z885 Allergy status to narcotic agent status: Secondary | ICD-10-CM | POA: Diagnosis not present

## 2018-11-22 DIAGNOSIS — Z01818 Encounter for other preprocedural examination: Secondary | ICD-10-CM | POA: Diagnosis not present

## 2018-11-22 DIAGNOSIS — K3189 Other diseases of stomach and duodenum: Secondary | ICD-10-CM | POA: Diagnosis not present

## 2018-11-22 DIAGNOSIS — Z9884 Bariatric surgery status: Secondary | ICD-10-CM | POA: Diagnosis not present

## 2018-11-22 DIAGNOSIS — E039 Hypothyroidism, unspecified: Secondary | ICD-10-CM | POA: Diagnosis not present

## 2018-11-22 DIAGNOSIS — Z7982 Long term (current) use of aspirin: Secondary | ICD-10-CM | POA: Diagnosis not present

## 2018-11-22 DIAGNOSIS — Z7984 Long term (current) use of oral hypoglycemic drugs: Secondary | ICD-10-CM | POA: Diagnosis not present

## 2018-11-22 DIAGNOSIS — Z79899 Other long term (current) drug therapy: Secondary | ICD-10-CM | POA: Diagnosis not present

## 2018-11-25 DIAGNOSIS — Z713 Dietary counseling and surveillance: Secondary | ICD-10-CM | POA: Diagnosis not present

## 2018-12-02 DIAGNOSIS — E038 Other specified hypothyroidism: Secondary | ICD-10-CM | POA: Diagnosis not present

## 2018-12-02 DIAGNOSIS — E559 Vitamin D deficiency, unspecified: Secondary | ICD-10-CM | POA: Diagnosis not present

## 2018-12-02 DIAGNOSIS — E119 Type 2 diabetes mellitus without complications: Secondary | ICD-10-CM | POA: Diagnosis not present

## 2018-12-02 DIAGNOSIS — E785 Hyperlipidemia, unspecified: Secondary | ICD-10-CM | POA: Diagnosis not present

## 2018-12-02 DIAGNOSIS — E7849 Other hyperlipidemia: Secondary | ICD-10-CM | POA: Diagnosis not present

## 2018-12-02 DIAGNOSIS — I10 Essential (primary) hypertension: Secondary | ICD-10-CM | POA: Diagnosis not present

## 2018-12-02 DIAGNOSIS — Z01818 Encounter for other preprocedural examination: Secondary | ICD-10-CM | POA: Diagnosis not present

## 2018-12-02 DIAGNOSIS — D649 Anemia, unspecified: Secondary | ICD-10-CM | POA: Diagnosis not present

## 2018-12-02 DIAGNOSIS — Z9884 Bariatric surgery status: Secondary | ICD-10-CM | POA: Diagnosis not present

## 2018-12-06 DIAGNOSIS — E039 Hypothyroidism, unspecified: Secondary | ICD-10-CM | POA: Diagnosis not present

## 2018-12-06 DIAGNOSIS — Z7984 Long term (current) use of oral hypoglycemic drugs: Secondary | ICD-10-CM | POA: Diagnosis not present

## 2018-12-06 DIAGNOSIS — Z9884 Bariatric surgery status: Secondary | ICD-10-CM | POA: Diagnosis not present

## 2018-12-06 DIAGNOSIS — E8881 Metabolic syndrome: Secondary | ICD-10-CM | POA: Diagnosis not present

## 2018-12-06 DIAGNOSIS — E038 Other specified hypothyroidism: Secondary | ICD-10-CM | POA: Diagnosis not present

## 2018-12-06 DIAGNOSIS — Z79899 Other long term (current) drug therapy: Secondary | ICD-10-CM | POA: Diagnosis not present

## 2018-12-06 DIAGNOSIS — E119 Type 2 diabetes mellitus without complications: Secondary | ICD-10-CM | POA: Diagnosis not present

## 2018-12-06 DIAGNOSIS — E7849 Other hyperlipidemia: Secondary | ICD-10-CM | POA: Diagnosis not present

## 2018-12-06 DIAGNOSIS — Z6841 Body Mass Index (BMI) 40.0 and over, adult: Secondary | ICD-10-CM | POA: Diagnosis not present

## 2018-12-06 DIAGNOSIS — I10 Essential (primary) hypertension: Secondary | ICD-10-CM | POA: Diagnosis not present

## 2018-12-06 DIAGNOSIS — Z885 Allergy status to narcotic agent status: Secondary | ICD-10-CM | POA: Diagnosis not present

## 2018-12-13 DIAGNOSIS — E538 Deficiency of other specified B group vitamins: Secondary | ICD-10-CM | POA: Diagnosis not present

## 2018-12-16 DIAGNOSIS — Z713 Dietary counseling and surveillance: Secondary | ICD-10-CM | POA: Diagnosis not present

## 2019-03-01 DIAGNOSIS — E038 Other specified hypothyroidism: Secondary | ICD-10-CM | POA: Diagnosis not present

## 2019-03-01 DIAGNOSIS — E1169 Type 2 diabetes mellitus with other specified complication: Secondary | ICD-10-CM | POA: Diagnosis not present

## 2019-03-08 DIAGNOSIS — E7849 Other hyperlipidemia: Secondary | ICD-10-CM | POA: Diagnosis not present

## 2019-03-08 DIAGNOSIS — I10 Essential (primary) hypertension: Secondary | ICD-10-CM | POA: Diagnosis not present

## 2019-03-08 DIAGNOSIS — R740 Nonspecific elevation of levels of transaminase and lactic acid dehydrogenase [LDH]: Secondary | ICD-10-CM | POA: Diagnosis not present

## 2019-03-08 DIAGNOSIS — E785 Hyperlipidemia, unspecified: Secondary | ICD-10-CM | POA: Diagnosis not present

## 2019-03-08 DIAGNOSIS — E1169 Type 2 diabetes mellitus with other specified complication: Secondary | ICD-10-CM | POA: Diagnosis not present

## 2019-03-15 DIAGNOSIS — Z713 Dietary counseling and surveillance: Secondary | ICD-10-CM | POA: Diagnosis not present

## 2019-04-07 DIAGNOSIS — E785 Hyperlipidemia, unspecified: Secondary | ICD-10-CM | POA: Diagnosis not present

## 2019-04-07 DIAGNOSIS — E1169 Type 2 diabetes mellitus with other specified complication: Secondary | ICD-10-CM | POA: Diagnosis not present

## 2019-04-07 DIAGNOSIS — I10 Essential (primary) hypertension: Secondary | ICD-10-CM | POA: Diagnosis not present

## 2019-06-23 DIAGNOSIS — M791 Myalgia, unspecified site: Secondary | ICD-10-CM | POA: Diagnosis not present

## 2019-06-23 DIAGNOSIS — R590 Localized enlarged lymph nodes: Secondary | ICD-10-CM | POA: Diagnosis not present

## 2019-06-23 DIAGNOSIS — Z9884 Bariatric surgery status: Secondary | ICD-10-CM | POA: Diagnosis not present

## 2019-06-23 DIAGNOSIS — R6883 Chills (without fever): Secondary | ICD-10-CM | POA: Diagnosis not present

## 2019-06-23 DIAGNOSIS — M2550 Pain in unspecified joint: Secondary | ICD-10-CM | POA: Diagnosis not present

## 2019-06-23 DIAGNOSIS — E538 Deficiency of other specified B group vitamins: Secondary | ICD-10-CM | POA: Diagnosis not present

## 2019-06-23 DIAGNOSIS — E039 Hypothyroidism, unspecified: Secondary | ICD-10-CM | POA: Diagnosis not present

## 2019-06-23 DIAGNOSIS — R5381 Other malaise: Secondary | ICD-10-CM | POA: Diagnosis not present

## 2019-06-23 DIAGNOSIS — I10 Essential (primary) hypertension: Secondary | ICD-10-CM | POA: Diagnosis not present

## 2019-06-29 ENCOUNTER — Other Ambulatory Visit: Payer: Self-pay | Admitting: Internal Medicine

## 2019-06-29 DIAGNOSIS — Z1231 Encounter for screening mammogram for malignant neoplasm of breast: Secondary | ICD-10-CM

## 2019-08-09 DIAGNOSIS — E7849 Other hyperlipidemia: Secondary | ICD-10-CM | POA: Diagnosis not present

## 2019-08-09 DIAGNOSIS — Z0000 Encounter for general adult medical examination without abnormal findings: Secondary | ICD-10-CM | POA: Diagnosis not present

## 2019-08-09 DIAGNOSIS — E538 Deficiency of other specified B group vitamins: Secondary | ICD-10-CM | POA: Diagnosis not present

## 2019-08-09 DIAGNOSIS — E038 Other specified hypothyroidism: Secondary | ICD-10-CM | POA: Diagnosis not present

## 2019-08-09 DIAGNOSIS — E1169 Type 2 diabetes mellitus with other specified complication: Secondary | ICD-10-CM | POA: Diagnosis not present

## 2019-08-16 ENCOUNTER — Ambulatory Visit: Payer: Commercial Managed Care - PPO

## 2019-08-16 DIAGNOSIS — Z1331 Encounter for screening for depression: Secondary | ICD-10-CM | POA: Diagnosis not present

## 2019-08-16 DIAGNOSIS — I10 Essential (primary) hypertension: Secondary | ICD-10-CM | POA: Diagnosis not present

## 2019-08-16 DIAGNOSIS — E785 Hyperlipidemia, unspecified: Secondary | ICD-10-CM | POA: Diagnosis not present

## 2019-08-16 DIAGNOSIS — E039 Hypothyroidism, unspecified: Secondary | ICD-10-CM | POA: Diagnosis not present

## 2019-08-16 DIAGNOSIS — Z0000 Encounter for general adult medical examination without abnormal findings: Secondary | ICD-10-CM | POA: Diagnosis not present

## 2019-09-20 DIAGNOSIS — H04123 Dry eye syndrome of bilateral lacrimal glands: Secondary | ICD-10-CM | POA: Diagnosis not present

## 2019-09-20 DIAGNOSIS — E119 Type 2 diabetes mellitus without complications: Secondary | ICD-10-CM | POA: Diagnosis not present

## 2019-09-20 DIAGNOSIS — H40023 Open angle with borderline findings, high risk, bilateral: Secondary | ICD-10-CM | POA: Diagnosis not present

## 2019-09-20 DIAGNOSIS — H2513 Age-related nuclear cataract, bilateral: Secondary | ICD-10-CM | POA: Diagnosis not present

## 2019-10-04 DIAGNOSIS — Z1231 Encounter for screening mammogram for malignant neoplasm of breast: Secondary | ICD-10-CM | POA: Diagnosis not present

## 2019-12-26 DIAGNOSIS — R1032 Left lower quadrant pain: Secondary | ICD-10-CM | POA: Diagnosis not present

## 2019-12-26 DIAGNOSIS — M545 Low back pain: Secondary | ICD-10-CM | POA: Diagnosis not present

## 2020-02-13 DIAGNOSIS — K912 Postsurgical malabsorption, not elsewhere classified: Secondary | ICD-10-CM | POA: Diagnosis not present

## 2020-02-13 DIAGNOSIS — Z9884 Bariatric surgery status: Secondary | ICD-10-CM | POA: Diagnosis not present

## 2020-02-14 DIAGNOSIS — E039 Hypothyroidism, unspecified: Secondary | ICD-10-CM | POA: Diagnosis not present

## 2020-02-14 DIAGNOSIS — E1169 Type 2 diabetes mellitus with other specified complication: Secondary | ICD-10-CM | POA: Diagnosis not present

## 2020-02-14 DIAGNOSIS — M545 Low back pain: Secondary | ICD-10-CM | POA: Diagnosis not present

## 2020-02-14 DIAGNOSIS — I10 Essential (primary) hypertension: Secondary | ICD-10-CM | POA: Diagnosis not present

## 2020-03-15 DIAGNOSIS — K912 Postsurgical malabsorption, not elsewhere classified: Secondary | ICD-10-CM | POA: Diagnosis not present

## 2020-03-15 DIAGNOSIS — Z9884 Bariatric surgery status: Secondary | ICD-10-CM | POA: Diagnosis not present

## 2020-05-11 DIAGNOSIS — H04123 Dry eye syndrome of bilateral lacrimal glands: Secondary | ICD-10-CM | POA: Diagnosis not present

## 2020-05-11 DIAGNOSIS — H2513 Age-related nuclear cataract, bilateral: Secondary | ICD-10-CM | POA: Diagnosis not present

## 2020-05-11 DIAGNOSIS — H1132 Conjunctival hemorrhage, left eye: Secondary | ICD-10-CM | POA: Diagnosis not present

## 2020-05-22 DIAGNOSIS — Z23 Encounter for immunization: Secondary | ICD-10-CM | POA: Diagnosis not present

## 2020-06-06 DIAGNOSIS — F419 Anxiety disorder, unspecified: Secondary | ICD-10-CM

## 2020-06-06 HISTORY — DX: Anxiety disorder, unspecified: F41.9

## 2020-06-19 DIAGNOSIS — Z23 Encounter for immunization: Secondary | ICD-10-CM | POA: Diagnosis not present

## 2020-07-11 DIAGNOSIS — H8112 Benign paroxysmal vertigo, left ear: Secondary | ICD-10-CM | POA: Diagnosis not present

## 2020-07-11 DIAGNOSIS — I10 Essential (primary) hypertension: Secondary | ICD-10-CM | POA: Diagnosis not present

## 2020-08-29 DIAGNOSIS — E039 Hypothyroidism, unspecified: Secondary | ICD-10-CM | POA: Diagnosis not present

## 2020-08-29 DIAGNOSIS — E1121 Type 2 diabetes mellitus with diabetic nephropathy: Secondary | ICD-10-CM | POA: Diagnosis not present

## 2020-08-29 DIAGNOSIS — E538 Deficiency of other specified B group vitamins: Secondary | ICD-10-CM | POA: Diagnosis not present

## 2020-08-29 DIAGNOSIS — Z0000 Encounter for general adult medical examination without abnormal findings: Secondary | ICD-10-CM | POA: Diagnosis not present

## 2020-08-29 DIAGNOSIS — E785 Hyperlipidemia, unspecified: Secondary | ICD-10-CM | POA: Diagnosis not present

## 2020-09-05 DIAGNOSIS — E039 Hypothyroidism, unspecified: Secondary | ICD-10-CM | POA: Diagnosis not present

## 2020-09-05 DIAGNOSIS — Z0000 Encounter for general adult medical examination without abnormal findings: Secondary | ICD-10-CM | POA: Diagnosis not present

## 2020-09-05 LAB — IFOBT (OCCULT BLOOD): IFOBT: POSITIVE

## 2020-10-01 ENCOUNTER — Encounter: Payer: Self-pay | Admitting: Internal Medicine

## 2020-10-01 ENCOUNTER — Ambulatory Visit (INDEPENDENT_AMBULATORY_CARE_PROVIDER_SITE_OTHER): Payer: BC Managed Care – PPO | Admitting: Internal Medicine

## 2020-10-01 ENCOUNTER — Encounter: Payer: Self-pay | Admitting: *Deleted

## 2020-10-01 ENCOUNTER — Other Ambulatory Visit: Payer: Self-pay

## 2020-10-01 ENCOUNTER — Telehealth: Payer: Self-pay | Admitting: Internal Medicine

## 2020-10-01 ENCOUNTER — Ambulatory Visit (INDEPENDENT_AMBULATORY_CARE_PROVIDER_SITE_OTHER): Payer: BC Managed Care – PPO

## 2020-10-01 VITALS — BP 138/76 | HR 45 | Ht 62.0 in | Wt 211.0 lb

## 2020-10-01 DIAGNOSIS — R002 Palpitations: Secondary | ICD-10-CM

## 2020-10-01 DIAGNOSIS — E669 Obesity, unspecified: Secondary | ICD-10-CM

## 2020-10-01 DIAGNOSIS — I152 Hypertension secondary to endocrine disorders: Secondary | ICD-10-CM

## 2020-10-01 DIAGNOSIS — R42 Dizziness and giddiness: Secondary | ICD-10-CM

## 2020-10-01 DIAGNOSIS — E1169 Type 2 diabetes mellitus with other specified complication: Secondary | ICD-10-CM | POA: Insufficient documentation

## 2020-10-01 DIAGNOSIS — E782 Mixed hyperlipidemia: Secondary | ICD-10-CM | POA: Diagnosis not present

## 2020-10-01 DIAGNOSIS — E1159 Type 2 diabetes mellitus with other circulatory complications: Secondary | ICD-10-CM

## 2020-10-01 MED ORDER — METOPROLOL TARTRATE 50 MG PO TABS: 50.0000 mg | ORAL_TABLET | Freq: Two times a day (BID) | ORAL | 1 refills | Status: DC

## 2020-10-01 MED ORDER — ROSUVASTATIN CALCIUM 10 MG PO TABS: 10.0000 mg | ORAL_TABLET | Freq: Every day | ORAL | 1 refills | Status: DC

## 2020-10-01 NOTE — Telephone Encounter (Signed)
Spoke with patient who reports she has not taken Rosuvastatin in over 1 year.  She states she doesn't know why she stopped it but doesn't remember and type of reaction.  She is asking if she should start back on 5 mg or 10 mg daily.  Advised will have Dr Izora Ribas to review and will c/b with further instructions.

## 2020-10-01 NOTE — Progress Notes (Signed)
Patient ID: Melissa Wyatt, female   DOB: 08/29/1966, 54 y.o.   MRN: [id number] Patient enrolled for [employer] to ship a 14 day ZIO XT long term holter monitor to her home.

## 2020-10-01 NOTE — Progress Notes (Signed)
Cardiology Office Note:    Date:  10/01/2020   ID:  Melissa Wyatt, DOB 1966/07/25, MRN [id number]  PCP:  Velna Hatchet, MD  [facility] Cardiologist:  No primary care provider on file.  [facility] Electrophysiologist:  None   Referring MD: Velna Hatchet, MD   CC: Dizziness Consulted for the evaluation of chest pain at the behest of Velna Hatchet, MD  History of Present Illness:    Melissa Wyatt is a 54 y.o. female with a hx of Morbid Obesity s/p gastric bypass, HTN, DM syndrome, HLD who presents for evaluation.  Patient notes that she is feeling dizziness associated with chest tightness.  Has had no chest pain, chest pressure, chest stinging.  Discomfort occurs with the night and early morning, no worsening with activity, and improves spotanensous.  Dizziness thought to be vertigo by her PCP; Patient exertion notable for walking and feels no symptoms.  No shortness of breath, DOE.   Dizziness only positional.   Notes occasional palpitations and funny heart beats; occurs at night.  No exertion or rest symptoms.  Patient reports prior cardiac testing including 2016 echo, 2016 stress test.  No history of pre-eclampsia.  Distant Fen-Phen (1 month in the distant past).  Ambulatory BP  Not done recently.  Past Medical History:  Diagnosis Date  . Hypertension   . Thyroid disease     Past Surgical History:  Procedure Laterality Date  . ABDOMINAL HYSTERECTOMY    . LAPAROSCOPIC GASTRIC BANDING  02/13/09  . PLANTAR FASCIA SURGERY      Current Medications: Current Meds  Medication Sig  . hydrochlorothiazide (HYDRODIURIL) 12.5 MG tablet Take 12.5 mg by mouth daily.  Marland Kitchen levothyroxine (SYNTHROID, LEVOTHROID) 125 MCG tablet Take 125 mcg by mouth daily.  Marland Kitchen lisinopril (ZESTRIL) 20 MG tablet Take 20 mg by mouth daily.  . [DISCONTINUED] amLODipine (NORVASC) 5 MG tablet Take 2 tablets (10 mg total) by mouth daily.  . [DISCONTINUED] esomeprazole (NEXIUM) 40 MG  capsule Take 40 mg by mouth 2 (two) times daily.  . [DISCONTINUED] losartan (COZAAR) 100 MG tablet Take 100 mg by mouth daily.  . [DISCONTINUED] metoprolol (LOPRESSOR) 100 MG tablet Take 100 mg by mouth 3 (three) times daily.  . [DISCONTINUED] rosuvastatin (CRESTOR) 5 MG tablet Take 1 tablet by mouth at bedtime.     Allergies:   Vicodin [hydrocodone-acetaminophen]   Social History   Socioeconomic History  . Marital status: Married    Spouse name: Not on file  . Number of children: Not on file  . Years of education: Not on file  . Highest education level: Not on file  Occupational History  . Not on file  Tobacco Use  . Smoking status: Never Smoker  . Smokeless tobacco: Never Used  Substance and Sexual Activity  . Alcohol use: No  . Drug use: No  . Sexual activity: Not on file  Other Topics Concern  . Not on file  Social History Narrative  . Not on file   Social Determinants of Health   Financial Resource Strain: Not on file  Food Insecurity: Not on file  Transportation Needs: Not on file  Physical Activity: Not on file  Stress: Not on file  Social Connections: Not on file     Family History: [name] family history includes Lung cancer in her father. History of coronary artery disease notable for mother and father. History of heart failure notable for mother. No history of cardiomyopathies including hypertrophic cardiomyopathy, left ventricular non-compaction, or  arrhythmogenic right ventricular cardiomyopathy. History of arrhythmia notable for atrial fibrillation. Denies family history of sudden cardiac death including drowning, car accidents, or unexplained deaths in the family. No history of bicuspid aortic valve or aortic aneurysm or dissection.  ROS:   Please see the history of present illness.    All other systems reviewed and are negative.  EKGs/Labs/Other Studies Reviewed:    The following studies were reviewed today:  EKG:  EKG is ordered today.   The ekg ordered today demonstrates Sinus Bradycardia Rate 45  Transthoracic Echocardiogram: Date: 06/08/2015 Results: Study Conclusions  - Procedure narrative: Transthoracic echocardiography. Image  quality was suboptimal. The study was technically difficult.  Intravenous contrast (Definity) was administered.  - Left ventricle: The cavity size was normal. Wall thickness was  increased in a pattern of mild LVH. Systolic function was normal.  The estimated ejection fraction was in the range of 60% to 65%.  Wall motion was normal; there were no regional wall motion  abnormalities. Doppler parameters are consistent with abnormal  left ventricular relaxation (grade 1 diastolic dysfunction).   Cardiac CT : Date:06/25/2006 Results: No CAC CORONARY CTA: Coronaries arose from the appropriate sinuses of Valsalva.   The left main coronary artery was normal.  The left anterior descending artery was normal. There was one fairly large branching first diagonal branch.   The patient had a large left dominate coronary artery. Circulation was normal. The distal PDA was somewhat suboptimally visualized.  The right coronary artery was fairly small and nondominant with a single RV branch.  The patients calcium score was 0.   ECG or NM Stress Testing: Date: 06/18/2014 Results:  The left ventricular ejection fraction is normal (55-65%).  Nuclear stress EF: 63%.  Blood pressure demonstrated a hypertensive response to exercise.  Horizontal ST segment depression ST segment depression of 0.5 mm was noted during stress in the II, III and aVF leads, and returning to baseline after 1-5 minutes of recovery.  No T wave inversion was noted during stress.  This is a low risk study.   Low risk study. No reversible ischemia. LVEF 63% with normal wall motion. Fair exercise tolerance with hypertensive BP response and occasional PVC's.   Recent Labs: No results found for requested labs within  last 8760 hours.  Recent Lipid Panel No results found for: CHOL, TRIG, HDL, CHOLHDL, VLDL, LDLCALC, LDLDIRECT  [facility] 08/29/2020 Total Cholesterol 210 HDL 46 TGs 185 LDL 120  12/26/19 Hgb 15 HCT 49 Platelet 287 Creatinine 0.7 AST/ALD 21/23  Risk Assessment/Calculations:     ASCVD 6.0 %  Physical Exam:    VS:  BP 138/76   Pulse (!) 45   Ht 5\' 2"  (1.575 m)   Wt 211 lb (95.7 kg)   SpO2 97%   BMI 38.59 kg/m     Wt Readings from Last 3 Encounters:  10/01/20 211 lb (95.7 kg)  06/18/15 283 lb (128.4 kg)  06/05/15 283 lb 12.8 oz (128.7 kg)     06/07/15, well developed in no acute distress HEENT: Normal NECK: No JVD; No carotid bruits LYMPHATICS: No lymphadenopathy CARDIAC: RRR, no murmurs, rubs, gallops RESPIRATORY:  Clear to auscultation without rales, wheezing or rhonchi  ABDOMEN: Soft, non-tender, non-distended MUSCULOSKELETAL:  No edema; No deformity  SKIN: Warm and dry NEUROLOGIC:  Alert and oriented x 3 PSYCHIATRIC:  Normal affect   ASSESSMENT:    1. Dizziness   2. Mixed hyperlipidemia   3. Palpitations   4. Obesity, diabetes, and hypertension syndrome (  Flasher)    PLAN:    In order of problems listed above:  Obesity/DM/HTN - ambulatory blood pressure not done, will start ambulatory BP monitoring; gave education on how to perform ambulatory blood pressure monitoring including the frequency and technique; goal ambulatory blood pressure < 135/85 on average - continue home medications with the exception of changing metoprolol to 50 mg BID - discussed diet (DASH/low sodium), and exercise/weight loss interventions   Palpitations with dizziness  Distant Fen-Phen use and dizziness (no PH on prior Echo) - will obtain 14-day non live heart monitor (ZioPatch) - AV Nodal Therapy as above  Hyperlipidemia (mixed) -LDL goal less than 100 - ASCVD risk 6% -will trial rosuvastatin 10 mg PO Nightly -Recheck lipid profile LFTs in 3 months - gave education on  dietary changes  3 months follow up unless new symptoms or abnormal test results warranting change in plan  Would be reasonable for  APP Follow up   Medication Adjustments/Labs and Tests Ordered: Current medicines are reviewed at length with the patient today.  Concerns regarding medicines are outlined above.  Orders Placed This Encounter  Procedures  . LONG TERM MONITOR (3-14 DAYS)  . EKG 12-Lead   Meds ordered this encounter  Medications  . metoprolol tartrate (LOPRESSOR) 50 MG tablet    Sig: Take 1 tablet (50 mg total) by mouth 2 (two) times daily.    Dispense:  180 tablet    Refill:  1  . rosuvastatin (CRESTOR) 10 MG tablet    Sig: Take 1 tablet (10 mg total) by mouth at bedtime.    Dispense:  90 tablet    Refill:  1    Patient Instructions  Medication Instructions: Your physician has recommended you make the following change in your medication:  -- DECREASE Metoprolol Tartrate (Lopressor) to 50 mg - Take 1 tablet by mouth twice daily -- NEW RX SENT -- INCREASE Rosuvastatin (Crestor) to 10 mg - Take 1 tablet by mouth daily at bedtime -- NEW RX SENT *If you need a refill on your cardiac medications before your next appointment, please call your pharmacy*  Testing/Procedures: Your physician has recommended that you wear a 14 day ZIO monitor. ZIO monitors are medical devices that record the heart's electrical activity. Doctors most often use these monitors to diagnose arrhythmias. Arrhythmias are problems with the speed or rhythm of the heartbeat. The monitor is a small, portable device. You can wear one while you do your normal daily activities. This is usually used to diagnose what is causing palpitations/syncope (passing out).  Follow-Up: At [facility], you and your health needs are our priority.  As part of our continuing mission to provide you with exceptional heart care, we have created designated Provider Care Teams.  These Care Teams include your primary Cardiologist  (physician) and Advanced Practice Providers (APPs -  Physician Assistants and Nurse Practitioners) who all work together to provide you with the care you need, when you need it.  We recommend signing up for the patient portal called "MyChart".  Sign up information is provided on this After Visit Summary.  MyChart is used to connect with patients for Virtual Visits (Telemedicine).  Patients are able to view lab/test results, encounter notes, upcoming appointments, etc.  Non-urgent messages can be sent to your provider as well.   To learn more about what you can do with MyChart, go to [email or web address].    Your next appointment:   Your physician recommends that you schedule a follow-up appointment in: 3  MONTHS with Dr. Gasper Sells   The format for your next appointment:   In Person with Rudean Haskell, MD  [place] Instructions  Your physician has requested you wear your ZIO patch monitor_14_days.  This is a single patch monitor.  [employer] supplies one patch monitor per enrollment.  Additional stickers are not available.  Please do not apply patch if you will be having a Nuclear Stress Test, Echocardiogram, Cardiac CT, MRI, or Chest Xray during the time frame you would be wearing the monitor. The patch cannot be worn during these tests.  You cannot remove and re-apply the ZIO XT patch monitor.   Your ZIO patch monitor will be sent USPS Priority mail from [facility] directly to your home address. The monitor may also be mailed to a PO BOX if home delivery is not available.   It may take 3-5 days to receive your monitor after you have been enrolled.   Once you have received you monitor, please review enclosed instructions.  Your monitor has already been registered assigning a specific monitor serial # to you.   Applying the monitor  1. Shave hair from upper left chest.  2. Hold abrader disc by orange tab.  Rub abrader in 40 strokes over left upper chest as  indicated in your monitor instructions. 3. Clean area with 4 enclosed alcohol pads .  Use all pads to assure are is cleaned thoroughly.  Let dry. 4. Apply patch as indicated in monitor instructions.  Patch will be place under collarbone on left side of chest with arrow pointing upward. 5. Rub patch adhesive wings for 2 minutes.Remove white label marked "1".  Remove white label marked "2".  Rub patch adhesive wings for 2 additional minutes. 6. While looking in a mirror, press and release button in center of patch.  A small green light will flash 3-4 times .  This will be your only indicator the monitor has been turned on. 7. Do not shower for the first 24 hours.  You may shower after the first 24 hours. 8. Press button if you feel a symptom. You will hear a small click.  Record Date, Time and Symptom in the Patient Log Book. 9. When you are ready to remove patch, follow instructions on last 2 pages of Patient Log Book.  Stick patch monitor onto last page of Patient Log Book. 10. Place Patient Log Book in [facility] box.  Use locking tab on box and tape box closed securely.  The Orange and [employer] has [employer] on it.  Please place in mailbox as soon as possible.  Your physician should have your test results approximately 7 days after the monitor has been mailed back to [facility].   -- Call [facility] at [phone] if you have questions regarding your ZIO XT patch monitor.  Call them immediately if you see an orange light blinking on your monitor.  -- If your monitor falls off in less than 4 days contact our Monitor department at [phone].  If your monitor becomes loose or falls off after 4 days call [employer] at [phone] for suggestions on securing your monitor       Signed, Werner Lean, MD  10/01/2020 9:05 AM    [place]

## 2020-10-01 NOTE — Patient Instructions (Addendum)
Medication Instructions: Your physician has recommended you make the following change in your medication:  -- DECREASE Metoprolol Tartrate (Lopressor) to 50 mg - Take 1 tablet by mouth twice daily -- NEW RX SENT -- INCREASE Rosuvastatin (Crestor) to 10 mg - Take 1 tablet by mouth daily at bedtime -- NEW RX SENT *If you need a refill on your cardiac medications before your next appointment, please call your pharmacy*  Testing/Procedures: Your physician has recommended that you wear a 14 day ZIO monitor. ZIO monitors are medical devices that record the heart's electrical activity. Doctors most often use these monitors to diagnose arrhythmias. Arrhythmias are problems with the speed or rhythm of the heartbeat. The monitor is a small, portable device. You can wear one while you do your normal daily activities. This is usually used to diagnose what is causing palpitations/syncope (passing out).  Follow-Up: At [facility], you and your health needs are our priority.  As part of our continuing mission to provide you with exceptional heart care, we have created designated Provider Care Teams.  These Care Teams include your primary Cardiologist (physician) and Advanced Practice Providers (APPs -  Physician Assistants and Nurse Practitioners) who all work together to provide you with the care you need, when you need it.  We recommend signing up for the patient portal called "MyChart".  Sign up information is provided on this After Visit Summary.  MyChart is used to connect with patients for Virtual Visits (Telemedicine).  Patients are able to view lab/test results, encounter notes, upcoming appointments, etc.  Non-urgent messages can be sent to your provider as well.   To learn more about what you can do with MyChart, go to [email or web address].    Your next appointment:   Your physician recommends that you schedule a follow-up appointment in: 3 MONTHS with Dr. Izora Ribas   The format for your next  appointment:   In Person with Riley Lam, MD  Christena Deem- Long Term Monitor Instructions  Your physician has requested you wear your ZIO patch monitor_14_days.  This is a single patch monitor.  Irhythm supplies one patch monitor per enrollment.  Additional stickers are not available.  Please do not apply patch if you will be having a Nuclear Stress Test, Echocardiogram, Cardiac CT, MRI, or Chest Xray during the time frame you would be wearing the monitor. The patch cannot be worn during these tests.  You cannot remove and re-apply the ZIO XT patch monitor.   Your ZIO patch monitor will be sent USPS Priority mail from [facility] directly to your home address. The monitor may also be mailed to a PO BOX if home delivery is not available.   It may take 3-5 days to receive your monitor after you have been enrolled.   Once you have received you monitor, please review enclosed instructions.  Your monitor has already been registered assigning a specific monitor serial # to you.   Applying the monitor  1. Shave hair from upper left chest.  2. Hold abrader disc by orange tab.  Rub abrader in 40 strokes over left upper chest as indicated in your monitor instructions. 3. Clean area with 4 enclosed alcohol pads .  Use all pads to assure are is cleaned thoroughly.  Let dry. 4. Apply patch as indicated in monitor instructions.  Patch will be place under collarbone on left side of chest with arrow pointing upward. 5. Rub patch adhesive wings for 2 minutes.Remove white label marked "1".  Remove white label marked "  2".  Rub patch adhesive wings for 2 additional minutes. 6. While looking in a mirror, press and release button in center of patch.  A small green light will flash 3-4 times .  This will be your only indicator the monitor has been turned on. 7. Do not shower for the first 24 hours.  You may shower after the first 24 hours. 8. Press button if you feel a symptom. You will hear a small  click.  Record Date, Time and Symptom in the Patient Log Book. 9. When you are ready to remove patch, follow instructions on last 2 pages of Patient Log Book.  Stick patch monitor onto last page of Patient Log Book. 10. Place Patient Log Book in [facility] box.  Use locking tab on box and tape box closed securely.  The Orange and [employer] has [employer] on it.  Please place in mailbox as soon as possible.  Your physician should have your test results approximately 7 days after the monitor has been mailed back to [facility].   -- Call [facility] at [phone] if you have questions regarding your ZIO XT patch monitor.  Call them immediately if you see an orange light blinking on your monitor.  -- If your monitor falls off in less than 4 days contact our Monitor department at [phone].  If your monitor becomes loose or falls off after 4 days call [employer] at [phone] for suggestions on securing your monitor

## 2020-10-01 NOTE — Telephone Encounter (Signed)
New message:     Patient had a apt this morning and the patient forgot that she do not take rosuvastatin but she do take lisinopril 20 mg. Patient states that he was going to up on the rosuvastatin, but patient has not been taking it. Please call patient.

## 2020-10-02 ENCOUNTER — Telehealth: Payer: Self-pay | Admitting: Internal Medicine

## 2020-10-02 ENCOUNTER — Encounter: Payer: Self-pay | Admitting: Nurse Practitioner

## 2020-10-02 DIAGNOSIS — Z79899 Other long term (current) drug therapy: Secondary | ICD-10-CM

## 2020-10-02 DIAGNOSIS — I10 Essential (primary) hypertension: Secondary | ICD-10-CM

## 2020-10-02 MED ORDER — LISINOPRIL 40 MG PO TABS: 40.0000 mg | ORAL_TABLET | Freq: Every day | ORAL | 3 refills | Status: DC

## 2020-10-02 NOTE — Telephone Encounter (Signed)
Patient calling to see if BP medicine could be changed due to it still being high. BP readings are:  151/98 179/106 195/113 195/98 168/104  Please call back to discuss

## 2020-10-02 NOTE — Telephone Encounter (Signed)
The patient has BP 151/98 was taken 12/27 after am medications. The 179/106, 195/113, 195/98, 168/104 taken today 11:20 am, 2:39 pm, retake, 3:30 pm. 163/100 current HR 50. Headache. Has not missed any medications. Normally takes Lisinopril 20 mg around 8pm. Patient took extra 50 mg of metoprolol tartrate around 3 pm.   MD Chandrasekhar advised verbally in person to increase Lisinopril 40 mg daily starting today and take the new dose now. Get lab BMP and Mag in 7 days (10/09/20). Advised not to take extra metoprolol tartrate because of low heart rate. Patient verbalized understanding.   Medication sent to [facility], orders placed for labs and appointment set up.

## 2020-10-03 NOTE — Telephone Encounter (Signed)
Christell Constant, MD    [facility]  Ok for Rosuvastatin 10 mg Po Daily (still low dose). Patient is also starting an increased dose of lisinopril (40 mg PO Daily) given high BP at home. Wishing patient happy birthday.   Routing comment    Attempted to call the patient back with the above recommendations. Left the patient a message to call the office back.

## 2020-10-04 DIAGNOSIS — R002 Palpitations: Secondary | ICD-10-CM | POA: Diagnosis not present

## 2020-10-04 NOTE — Telephone Encounter (Signed)
Spoke to patient and gave recommendations.  Verbalized understanding

## 2020-10-06 DIAGNOSIS — U071 COVID-19: Secondary | ICD-10-CM

## 2020-10-06 HISTORY — DX: COVID-19: U07.1

## 2020-10-09 ENCOUNTER — Other Ambulatory Visit: Payer: BC Managed Care – PPO

## 2020-10-22 ENCOUNTER — Ambulatory Visit: Payer: BC Managed Care – PPO | Admitting: Nurse Practitioner

## 2020-10-24 ENCOUNTER — Telehealth: Payer: Self-pay

## 2020-10-24 DIAGNOSIS — R002 Palpitations: Secondary | ICD-10-CM

## 2020-10-24 DIAGNOSIS — I471 Supraventricular tachycardia: Secondary | ICD-10-CM

## 2020-10-24 DIAGNOSIS — R9431 Abnormal electrocardiogram [ECG] [EKG]: Secondary | ICD-10-CM

## 2020-10-24 DIAGNOSIS — R001 Bradycardia, unspecified: Secondary | ICD-10-CM

## 2020-10-24 NOTE — Telephone Encounter (Signed)
The patient has been notified of the result and verbalized understanding.  All questions (if any) were answered. Ewell Poe [place], RN 10/24/2020 3:03 PM   Placed order for EP referral.

## 2020-10-24 NOTE — Telephone Encounter (Signed)
-----   Message from Werner Lean, MD sent at 10/24/2020  2:51 PM EST ----- Results: Symptomatic SVT with bradycardia (unclear associated symptoms) on present beta blocker Plan: EP Referral  Werner Lean, MD

## 2020-10-26 ENCOUNTER — Other Ambulatory Visit (INDEPENDENT_AMBULATORY_CARE_PROVIDER_SITE_OTHER): Payer: Managed Care, Other (non HMO)

## 2020-10-26 ENCOUNTER — Ambulatory Visit (INDEPENDENT_AMBULATORY_CARE_PROVIDER_SITE_OTHER): Payer: Managed Care, Other (non HMO) | Admitting: Nurse Practitioner

## 2020-10-26 ENCOUNTER — Encounter: Payer: Self-pay | Admitting: Nurse Practitioner

## 2020-10-26 ENCOUNTER — Other Ambulatory Visit: Payer: Self-pay

## 2020-10-26 VITALS — BP 134/80 | HR 56 | Ht 61.0 in | Wt 212.1 lb

## 2020-10-26 DIAGNOSIS — Z79899 Other long term (current) drug therapy: Secondary | ICD-10-CM

## 2020-10-26 DIAGNOSIS — R195 Other fecal abnormalities: Secondary | ICD-10-CM

## 2020-10-26 DIAGNOSIS — I10 Essential (primary) hypertension: Secondary | ICD-10-CM

## 2020-10-26 DIAGNOSIS — Z9884 Bariatric surgery status: Secondary | ICD-10-CM

## 2020-10-26 LAB — CBC
HCT: 41.9 % (ref 36.0–46.0)
Hemoglobin: 14.4 g/dL (ref 12.0–15.0)
MCHC: 34.3 g/dL (ref 30.0–36.0)
MCV: 81.6 fl (ref 78.0–100.0)
Platelets: 307 10*3/uL (ref 150.0–400.0)
RBC: 5.13 Mil/uL — ABNORMAL HIGH (ref 3.87–5.11)
RDW: 13.5 % (ref 11.5–15.5)
WBC: 5.4 10*3/uL (ref 4.0–10.5)

## 2020-10-26 MED ORDER — PLENVU 140 G PO SOLR: 1.0000 | ORAL | 0 refills | Status: DC

## 2020-10-26 NOTE — Progress Notes (Signed)
10/26/2020 Melissa Wyatt [id number] August 16, 1966   CHIEF COMPLAINT: Positive hemosure test   HISTORY OF PRESENT ILLNESS:  Melissa Koller. Wyatt is a 55 year old female with a past medical history of hypertension, hyperlipidemia, hypothyroidism, diabetes mellitus type 2 and COVID 19 10/06/2020. S/P gastric sleeve surgery in 2010, Roux-en-Y gastric bypass surgery 12/05/2018 and a partial hysterectomy 2005. She presents to our office today as referred by Dr. Velna Hatchet to schedule a colonoscopy.  She underwent a Hemosure test which was positive on 09/05/2020.  Laboratory studies 08/29/2020 showed a normal CMP.  His CBC was not done.  She reports passing a normal formed brown bowel movement most days.  No rectal bleeding or melena.  No dysphagia or heartburn.  No upper or lower abdominal pain.  She underwent an EGD prior to her gastric sleeve surgery in 2010, no significant findings were identified per her recollection.  She is followed by cardiologist Dr.Chandrasekhar with history of hypertension and hyperlipidemia.  At the time of her last office visit on 10/01/2020 she reported feeling occasional palpitations with mild dizziness.  A Zio patch study was completed 10/23/2020 which showed a variable heart rate of 42 to a maximum heart rate of 210 with a 6 beat run of SVT.  Since then, her Metoprolol dose was reduced to 50 mg twice daily which she is tolerating.  She denies having any further dizziness or palpitations.  No chest pain or shortness of breath.   Zio patch monitor 10/23/2020: Patient had a min HR of 42 bpm, max HR of 210 bpm, and avg HR of 63 bpm. Predominant underlying rhythm was Sinus Rhythm. 41 Supraventricular Tachycardia runs occurred, the run with the fastest interval lasting 6 beats with a max rate of 210 bpm, the longest lasting 16.6 secs with an avg rate of 111 bpm. Supraventricular Tachycardia was detected within +/- 45 seconds of symptomatic patient event(s).  Isolated SVEs were rare (<1.0%), SVE Couplets were rare (<1.0%), and SVE Triplets were rare (<1.0%). Isolated VEs were rare (<1.0%), and no VE Couplets or VE Triplets were present.   Past Medical History:  Diagnosis Date  . COVID-19 10/2020  . Diabetes mellitus ([place])    reversed after gastric bypass  . HLD (hyperlipidemia)   . Hypertension   . Hypothyroidism    Past Surgical History:  Procedure Laterality Date  . ABDOMINAL HYSTERECTOMY    . GASTRIC BYPASS  12/2018  . LAPAROSCOPIC GASTRIC BANDING  02/13/09  . PLANTAR FASCIA SURGERY Bilateral   . URETHRA SURGERY  1970   Social history: She is married.  She has 1 daughter.  She is a Engineer, mining.  She is a non-smoker.  No alcohol use.  No drug use.  Family History: Father died age 56 secondary to lung cancer, Struve hypertension and hyperlipidemia.  Mother died age 57 with acute kidney failure, history of atrial fibrillation.  She has 3 brothers, the oldest brother has diabetes.  She has 1 sister who is healthy.  Allergies  Allergen Reactions  . Vicodin [Hydrocodone-Acetaminophen] Nausea And Vomiting and Other (See Comments)    Makes patient break out into a cold sweat.      Outpatient Encounter Medications as of 10/26/2020  Medication Sig  . hydrochlorothiazide (HYDRODIURIL) 12.5 MG tablet Take 12.5 mg by mouth daily.  Marland Kitchen levothyroxine (SYNTHROID, LEVOTHROID) 125 MCG tablet Take 125 mcg by mouth daily.  Marland Kitchen lisinopril (ZESTRIL) 40 MG tablet Take 1 tablet (40 mg total) by mouth daily.  . metoprolol  tartrate (LOPRESSOR) 50 MG tablet Take 1 tablet (50 mg total) by mouth 2 (two) times daily.  Marland Kitchen PEG-KCl-NaCl-NaSulf-Na Asc-C (PLENVU) 140 g SOLR Take 1 kit by mouth as directed. Use coupon: BIN: [id number] PNC: CNRX Group: [id number] ID: [id number]  . rosuvastatin (CRESTOR) 10 MG tablet Take 1 tablet (10 mg total) by mouth at bedtime.   No facility-administered encounter medications on file as of 10/26/2020.    REVIEW OF  SYSTEMS: Gen: Denies fever, sweats or chills. No weight loss.  CV: See HPI. Resp: Denies cough, shortness of breath of hemoptysis.  GI:See HPI. GU : Denies urinary burning, blood in urine, increased urinary frequency or incontinence. MS: Denies joint pain, muscles aches or weakness. Derm: Denies rash, itchiness, skin lesions or unhealing ulcers. Psych: Denies depression, anxiety, memory loss, suicidal ideation and confusion. Heme: Denies bruising, bleeding. Neuro:  Denies headaches, dizziness or paresthesias. Endo:  + DM II.  PHYSICAL EXAM: BP 134/80 (BP Location: Left Arm, Patient Position: Sitting, Cuff Size: Large)   Pulse (!) 56   Ht _0  (1.549 m) Comment: height measured without shoes  Wt 212 lb 2 oz (96.2 kg)   BMI 40.08 kg/m   Patient reported weight prior to gastric by pass was 279lbs .  General: Well developed 55 year old female in no acute distress. Head: Normocephalic and atraumatic. Eyes:  Sclerae non-icteric, conjunctive pink. Ears: Normal auditory acuity. Mouth: Dentition intact. No ulcers or lesions.  Neck: Supple, no lymphadenopathy or thyromegaly.  Lungs: Clear bilaterally to auscultation without wheezes, crackles or rhonchi. Heart: Regular rate and rhythm. No murmur, rub or gallop appreciated.  Abdomen: Soft, nontender, non distended. No masses. No hepatosplenomegaly. Normoactive bowel sounds x 4 quadrants.  Laparoscopic scars intact. Rectal: Deferred. Musculoskeletal: Symmetrical with no gross deformities. Skin: Warm and dry. No rash or lesions on visible extremities. Extremities: No edema. Neurological: Alert oriented x 4, no focal deficits.  Psychological:  Alert and cooperative. Normal mood and affect.  ASSESSMENT AND PLAN:  44.  55 year old female with a history of gastric sleeve surgery in 2010 then Roux-en-Y gastric bypass surgery/2020 presents for further evaluation regarding a positive Hemosure test.  No overt signs of GI bleeding.  -CBC -EGD  recommended to rule out any source of bleeding from the upper GI tract/anastomosis sites in setting of  Roux-en-Y gastric bypass surgery 12/2018 -Colonoscopy to rule out source of bleeding from the lower GI tract, rule out colorectal malignancy  -EGD and colonoscopy benefits and risks discussed including risk with sedation, risk of bleeding, perforation and infection  -Dr. Havery Moros to further verify if EGD warranted at time of colonoscopy   2.  Hypertension. Bradycardia. SVT.  Metoprolol 42m po bid. -Patient to schedule EGD and colonoscopy after her follow-up appointment with her cardiologist  3. DM II          CC:  HVelna Hatchet MD

## 2020-10-26 NOTE — Progress Notes (Signed)
Agree with assessment and plan as outlined. She had a positive hemosure (FIT) which is much more specific for colonic source. If she has no upper tract symptoms I think okay to do just colonoscopy at this time. Thanks

## 2020-10-26 NOTE — Patient Instructions (Signed)
If you are age 56 or older, your body mass index should be between 23-30. Your Body mass index is 40.08 kg/m. If this is out of the aforementioned range listed, please consider follow up with your Primary Care Provider.  If you are age 68 or younger, your body mass index should be between 19-25. Your Body mass index is 40.08 kg/m. If this is out of the aformentioned range listed, please consider follow up with your Primary Care Provider.    You have been scheduled for an endoscopy and colonoscopy. Please follow the written instructions given to you at your visit today. Please pick up your prep supplies at the pharmacy within the next 1-3 days. If you use inhalers (even only as needed), please bring them with you on the day of your procedure.  LABS: Your provider has requested that you go to the basement level for lab work before leaving today. Press "B" on the elevator. The lab is located at the first door on the left as you exit the elevator.  HEALTHCARE LAWS AND MY CHART RESULTS: Due to recent changes in healthcare laws, you may see the results of your imaging and laboratory studies on MyChart before your provider has had a chance to review them.  We understand that in some cases there may be results that are confusing or concerning to you. Not all laboratory results come back in the same time frame and the provider may be waiting for multiple results in order to interpret others.  Please give [place] 48 hours in order for your provider to thoroughly review all the results before contacting the office for clarification of your results.   It was great seeing you today!  Thank you for entrusting me with your care and choosing [facility].  Noralyn Pick, CRNP

## 2020-10-29 ENCOUNTER — Telehealth: Payer: Self-pay

## 2020-10-29 NOTE — Telephone Encounter (Signed)
-----   Message from Noralyn Pick, NP sent at 10/29/2020  8:11 AM EST ----- Claiborne Billings, pls contact the patient and let her know her CBC was normal. Pls let her know Dr. Havery Moros reviewed her consult and due to her positive Hemosure test and not UGI symptoms he verified an EGD is not necessary. Pls cancel the EGD and patient to proceed with only a colonoscopy. Thank you.  ----- Message ----- From: Yetta Flock, MD Sent: 10/26/2020   5:27 PM EST To: Noralyn Pick, NP    ----- Message ----- From: Noralyn Pick, NP Sent: 10/26/2020   5:21 PM EST To: Yetta Flock, MD

## 2020-10-29 NOTE — Telephone Encounter (Signed)
Spoke to patient to inform her of recent lab results and Dr Ozella Rocks recommendations. Patient will only have a colonoscopy on 12/18/20 at 3 pm. EGD removed. All questions answered,patient voiced understanding.

## 2020-11-07 ENCOUNTER — Other Ambulatory Visit: Payer: Self-pay

## 2020-11-07 ENCOUNTER — Other Ambulatory Visit: Payer: Managed Care, Other (non HMO)

## 2020-11-07 ENCOUNTER — Encounter: Payer: Self-pay | Admitting: Cardiology

## 2020-11-07 ENCOUNTER — Ambulatory Visit (INDEPENDENT_AMBULATORY_CARE_PROVIDER_SITE_OTHER): Payer: Managed Care, Other (non HMO) | Admitting: Cardiology

## 2020-11-07 VITALS — BP 144/80 | HR 58 | Ht 61.0 in | Wt 212.4 lb

## 2020-11-07 DIAGNOSIS — Z79899 Other long term (current) drug therapy: Secondary | ICD-10-CM

## 2020-11-07 DIAGNOSIS — R002 Palpitations: Secondary | ICD-10-CM | POA: Diagnosis not present

## 2020-11-07 DIAGNOSIS — I10 Essential (primary) hypertension: Secondary | ICD-10-CM | POA: Diagnosis not present

## 2020-11-07 DIAGNOSIS — I471 Supraventricular tachycardia: Secondary | ICD-10-CM | POA: Diagnosis not present

## 2020-11-07 NOTE — Patient Instructions (Signed)
Medication Instructions:  Your physician recommends that you continue on your current medications as directed. Please refer to the Current Medication list given to you today.  *If you need a refill on your cardiac medications before your next appointment, please call your pharmacy*   Lab Work: None ordered   Testing/Procedures: None ordered   Follow-Up: At [facility], you and your health needs are our priority.  As part of our continuing mission to provide you with exceptional heart care, we have created designated Provider Care Teams.  These Care Teams include your primary Cardiologist (physician) and Advanced Practice Providers (APPs -  Physician Assistants and Nurse Practitioners) who all work together to provide you with the care you need, when you need it.  Your next appointment:   6 month(s)  The format for your next appointment:   In Person  Provider:   Cameron Lambert, MD    Thank you for choosing [facility]!!     

## 2020-11-07 NOTE — Progress Notes (Signed)
Electrophysiology Office Note:    Date:  11/07/2020   ID:  Melissa Wyatt, DOB 06-10-1966, MRN [id number]  PCP:  Velna Hatchet, MD  [facility] Cardiologist:  No primary care provider on file.  [facility] Electrophysiologist:  None   Referring MD: Rudean Haskell A*   Chief Complaint: SVT  History of Present Illness:    Melissa Wyatt is a 55 y.o. female who presents for an evaluation of SVT at the request of Dr. Gasper Sells. Their medical history includes diabetes, hypertension, hypothyroidism.  She was last seen by Dr. Gasper Sells on October 01, 2020.  This appointment was for dizziness.  The dizziness is sometimes associated with chest tightness.  Patient tells me that she sometimes feels her heart flutter but these episodes are not sustained.  They have never been associated with lightheadedness or dizziness.  She describes a recent episode of lightheadedness and dizziness that prompted the recent evaluation.  She tells me that she arose quickly from laying down and felt lightheaded very quickly and then sat back down on the bed.  She never lost consciousness.  Past Medical History:  Diagnosis Date  . COVID-19 10/2020  . Diabetes mellitus ([place])    reversed after gastric bypass  . HLD (hyperlipidemia)   . Hypertension   . Hypothyroidism     Past Surgical History:  Procedure Laterality Date  . ABDOMINAL HYSTERECTOMY    . GASTRIC BYPASS  12/2018  . LAPAROSCOPIC GASTRIC BANDING  02/13/09  . PLANTAR FASCIA SURGERY Bilateral   . URETHRA SURGERY  1970    Current Medications: Current Meds  Medication Sig  . hydrochlorothiazide (HYDRODIURIL) 12.5 MG tablet Take 12.5 mg by mouth daily.  Marland Kitchen levothyroxine (SYNTHROID, LEVOTHROID) 125 MCG tablet Take 125 mcg by mouth daily.  Marland Kitchen lisinopril (ZESTRIL) 40 MG tablet Take 1 tablet (40 mg total) by mouth daily.  . metoprolol tartrate (LOPRESSOR) 50 MG tablet Take 1 tablet (50 mg total) by mouth 2 (two) times  daily.  . rosuvastatin (CRESTOR) 10 MG tablet Take 1 tablet (10 mg total) by mouth at bedtime.     Allergies:   Vicodin [hydrocodone-acetaminophen]   Social History   Socioeconomic History  . Marital status: Married    Spouse name: Not on file  . Number of children: 1  . Years of education: Not on file  . Highest education level: Not on file  Occupational History  . Occupation: Engineer, mining  Tobacco Use  . Smoking status: Never Smoker  . Smokeless tobacco: Never Used  Vaping Use  . Vaping Use: Never used  Substance and Sexual Activity  . Alcohol use: No  . Drug use: No  . Sexual activity: Not on file  Other Topics Concern  . Not on file  Social History Narrative  . Not on file   Social Determinants of Health   Financial Resource Strain: Not on file  Food Insecurity: Not on file  Transportation Needs: Not on file  Physical Activity: Not on file  Stress: Not on file  Social Connections: Not on file     Family History: [name] family history includes Atrial fibrillation in her mother; Cancer in her maternal grandfather and maternal uncle; Colon polyps in her father; Diabetes in her brother; Heart attack in her mother; Heart disease in her father and mother; Hyperlipidemia in her father and mother; Hypertension in her father and mother; Kidney failure in her mother; Lung cancer in her father.  ROS:   Please see the history  of present illness.    All other systems reviewed and are negative.  EKGs/Labs/Other Studies Reviewed:    The following studies were reviewed today:  October 23, 2020 ZIO personally reviewed Average heart rate 63 bpm.  41 episodes of SVT, longest lasting 16.6 seconds.  There were episodes of SVT corresponding to patient triggered events.        EKG:  The ekg ordered today demonstrates sinus rhythm.  Ventricular rate 58 bpm.  No preexcitation.  No PVCs.  QTc normal.  Recent Labs: 10/26/2020: Hemoglobin 14.4; Platelets 307.0   Recent Lipid Panel No results found for: CHOL, TRIG, HDL, CHOLHDL, VLDL, LDLCALC, LDLDIRECT  Physical Exam:    VS:  BP (!) 144/80   Pulse (!) 58   Ht 5\' 1"  (1.549 m)   Wt 212 lb 6.4 oz (96.3 kg)   SpO2 98%   BMI 40.13 kg/m     Wt Readings from Last 3 Encounters:  11/07/20 212 lb 6.4 oz (96.3 kg)  10/26/20 212 lb 2 oz (96.2 kg)  10/01/20 211 lb (95.7 kg)     GEN:  Well nourished, well developed in no acute distress HEENT: Normal NECK: No JVD; No carotid bruits LYMPHATICS: No lymphadenopathy CARDIAC: RRR, no murmurs, rubs, gallops RESPIRATORY:  Clear to auscultation without rales, wheezing or rhonchi  ABDOMEN: Soft, non-tender, non-distended MUSCULOSKELETAL:  No edema; No deformity  SKIN: Warm and dry NEUROLOGIC:  Alert and oriented x 3 PSYCHIATRIC:  Normal affect   ASSESSMENT:    1. SVT (supraventricular tachycardia) (HCC)   2. Palpitations   3. Medication management   4. Hypertension, unspecified type    PLAN:    In order of problems listed above:  1. SVT Nonsustained episodes.  From reviewing the monitor data, it appears to be an atrial tachycardia although cannot completely exclude AV nodal reentrant tachycardia or other SVT mechanism.  We discussed the treatment options for the SVT including medical therapy versus invasive EP study plus ablation.  Given the nonsustained nature of these episodes, we will plan to continue her metoprolol tartrate 50 mg twice daily to manage the SVT.  If the symptoms become more frequent or more severe, can certainly plan for an EP study with possible ablation.  Follow-up 6 months or sooner as needed.   Medication Adjustments/Labs and Tests Ordered: Current medicines are reviewed at length with the patient today.  Concerns regarding medicines are outlined above.  Orders Placed This Encounter  Procedures  . Magnesium  . EKG 12-Lead   No orders of the defined types were placed in this encounter.    Signed, Lars Mage,  MD, [facility]  11/07/2020 4:38 PM    Electrophysiology [place]

## 2020-11-08 LAB — BASIC METABOLIC PANEL
BUN/Creatinine Ratio: 28 — ABNORMAL HIGH (ref 9–23)
BUN: 17 mg/dL (ref 6–24)
CO2: 25 mmol/L (ref 20–29)
Calcium: 10 mg/dL (ref 8.7–10.2)
Chloride: 99 mmol/L (ref 96–106)
Creatinine, Ser: 0.6 mg/dL (ref 0.57–1.00)
GFR calc Af Amer: 119 mL/min/{1.73_m2} (ref >59)
GFR calc non Af Amer: 103 mL/min/{1.73_m2} (ref >59)
Glucose: 76 mg/dL (ref 65–99)
Potassium: 5 mmol/L (ref 3.5–5.2)
Sodium: 139 mmol/L (ref 134–144)

## 2020-11-08 LAB — MAGNESIUM: Magnesium: 2.3 mg/dL (ref 1.6–2.3)

## 2020-11-08 LAB — SPECIMEN STATUS REPORT

## 2020-12-18 ENCOUNTER — Encounter: Payer: Managed Care, Other (non HMO) | Admitting: Gastroenterology

## 2021-01-08 ENCOUNTER — Ambulatory Visit: Payer: Managed Care, Other (non HMO) | Admitting: Internal Medicine

## 2021-01-22 ENCOUNTER — Ambulatory Visit (AMBULATORY_SURGERY_CENTER): Payer: Managed Care, Other (non HMO)

## 2021-01-22 VITALS — Ht 61.0 in | Wt 215.0 lb

## 2021-01-22 DIAGNOSIS — R195 Other fecal abnormalities: Secondary | ICD-10-CM

## 2021-01-22 NOTE — Progress Notes (Signed)
No egg or soy allergy known to patient  No issues with past sedation with any surgeries or procedures Patient denies ever being told they had issues or difficulty with intubation  No FH of Malignant Hyperthermia No diet pills per patient No home 02 use per patient  No blood thinners per patient  Pt denies issues with constipation  No A fib or A flutter  EMMI video to pt or via [place] 19 guidelines implemented in PV today with Pt and RN  Pt is fully vaccinated  for Covid   Virtual pre-visit  Pt has plenvu at home  Due to the COVID-19 pandemic we are asking patients to follow certain guidelines.  Pt aware of COVID protocols and [facility] guidelines

## 2021-02-04 ENCOUNTER — Encounter: Payer: Self-pay | Admitting: Gastroenterology

## 2021-02-05 ENCOUNTER — Other Ambulatory Visit: Payer: Self-pay

## 2021-02-05 ENCOUNTER — Encounter: Payer: Self-pay | Admitting: Gastroenterology

## 2021-02-05 ENCOUNTER — Ambulatory Visit (AMBULATORY_SURGERY_CENTER): Payer: Managed Care, Other (non HMO) | Admitting: Gastroenterology

## 2021-02-05 VITALS — BP 152/80 | HR 47 | Temp 98.2°F | Resp 18 | Ht 61.0 in | Wt 215.0 lb

## 2021-02-05 DIAGNOSIS — D122 Benign neoplasm of ascending colon: Secondary | ICD-10-CM | POA: Diagnosis not present

## 2021-02-05 DIAGNOSIS — D124 Benign neoplasm of descending colon: Secondary | ICD-10-CM

## 2021-02-05 DIAGNOSIS — K5730 Diverticulosis of large intestine without perforation or abscess without bleeding: Secondary | ICD-10-CM | POA: Diagnosis not present

## 2021-02-05 DIAGNOSIS — K648 Other hemorrhoids: Secondary | ICD-10-CM

## 2021-02-05 DIAGNOSIS — D123 Benign neoplasm of transverse colon: Secondary | ICD-10-CM | POA: Diagnosis not present

## 2021-02-05 DIAGNOSIS — R195 Other fecal abnormalities: Secondary | ICD-10-CM | POA: Diagnosis not present

## 2021-02-05 DIAGNOSIS — D125 Benign neoplasm of sigmoid colon: Secondary | ICD-10-CM

## 2021-02-05 MED ORDER — SODIUM CHLORIDE 0.9 % IV SOLN: 500.0000 mL | INTRAVENOUS | Status: DC

## 2021-02-05 NOTE — Progress Notes (Signed)
Called to room to assist during endoscopic procedure.  Patient ID and intended procedure confirmed with present staff. Received instructions for my participation in the procedure from the performing physician.  

## 2021-02-05 NOTE — Progress Notes (Signed)
PT taken to PACU. Monitors in place. VSS. Report given to RN. 

## 2021-02-05 NOTE — Op Note (Signed)
[place] Patient Name: Melissa Wyatt Procedure Date: 02/05/2021 9:17 AM MRN: [id number] Endoscopist: Remo Lipps P. Havery Moros , MD Age: 55 Referring MD:  Date of Birth: Jul 05, 1966 Gender: Female Account #: [id number] Procedure:                Colonoscopy Indications:              This is the patient's first colonoscopy, Positive                            fecal immunochemical test Medicines:                Monitored Anesthesia Care Procedure:                Pre-Anesthesia Assessment:                           - Prior to the procedure, a History and Physical                            was performed, and patient medications and                            allergies were reviewed. [name] tolerance of                            previous anesthesia was also reviewed. The risks                            and benefits of the procedure and the sedation                            options and risks were discussed with the patient.                            All questions were answered, and informed consent                            was obtained. Prior Anticoagulants: The patient has                            taken no previous anticoagulant or antiplatelet                            agents. ASA Grade Assessment: III - A patient with                            severe systemic disease. After reviewing the risks                            and benefits, the patient was deemed in                            satisfactory condition to undergo the procedure.  After obtaining informed consent, the colonoscope                            was passed under direct vision. Throughout the                            procedure, the patient's blood pressure, pulse, and                            oxygen saturations were monitored continuously. The                            Olympus CF-HQ190L ([id number]) Colonoscope was                            introduced through the  anus and advanced to the the                            terminal ileum, with identification of the                            appendiceal orifice and IC valve. The colonoscopy                            was performed without difficulty. The patient                            tolerated the procedure well. The quality of the                            bowel preparation was good. The terminal ileum,                            ileocecal valve, appendiceal orifice, and rectum                            were photographed. Scope In: 9:20:12 AM Scope Out: 9:46:50 AM Scope Withdrawal Time: 0 hours 24 minutes 8 seconds  Total Procedure Duration: 0 hours 26 minutes 38 seconds  Findings:                 The perianal and digital rectal examinations were                            normal.                           The terminal ileum appeared normal.                           Two sessile polyps were found in the ascending                            colon. The polyps were 3 to 4 mm in size. These  polyps were removed with a cold snare. Resection                            and retrieval were complete.                           Two sessile polyps were found in the transverse                            colon. The polyps were 3 to 4 mm in size. These                            polyps were removed with a cold snare. Resection                            and retrieval were complete.                           Two sessile polyps were found in the descending                            colon. The polyps were 4 mm in size. These polyps                            were removed with a cold snare. Resection and                            retrieval were complete.                           Three sessile polyps were found in the sigmoid                            colon. The polyps were 3 to 4 mm in size. These                            polyps were removed with a cold snare. Resection                             and retrieval were complete.                           Scattered small-mouthed diverticula were found in                            the distal transverse colon and left colon.                           Internal hemorrhoids were found during retroflexion.                           The exam was otherwise without abnormality. Complications:            No immediate complications. Estimated blood loss:  Minimal. Estimated Blood Loss:     Estimated blood loss was minimal. Impression:               - The examined portion of the ileum was normal.                           - Two 3 to 4 mm polyps in the ascending colon,                            removed with a cold snare. Resected and retrieved.                           - One 4 mm polyp in the transverse colon, removed                            with a cold snare. Resected and retrieved.                           - Two 4 mm polyps in the descending colon, removed                            with a cold snare. Resected and retrieved.                           - Three 3 to 4 mm polyps in the sigmoid colon,                            removed with a cold snare. Resected and retrieved.                           - Diverticulosis in the distal transverse colon and                            in the left colon.                           - Internal hemorrhoids.                           - The examination was otherwise normal. Recommendation:           - Patient has a contact number available for                            emergencies. The signs and symptoms of potential                            delayed complications were discussed with the                            patient. Return to normal activities tomorrow.                            Written discharge instructions were provided to the  patient.                           - Resume previous diet.                           - Continue  present medications.                           - Await pathology results. Remo Lipps P. Lashea Goda, MD 02/05/2021 9:54:24 AM This report has been signed electronically.

## 2021-02-05 NOTE — Patient Instructions (Signed)
 Handouts on polyps,diverticulosis ,& hemorrhoids given to you today  Await pathology results on polyps removed    YOU HAD AN ENDOSCOPIC PROCEDURE TODAY AT THE [facility]:   Refer to the procedure report that was given to you for any specific questions about what was found during the examination.  If the procedure report does not answer your questions, please call your gastroenterologist to clarify.  If you requested that your care partner not be given the details of your procedure findings, then the procedure report has been included in a sealed envelope for you to review at your convenience later.  YOU SHOULD EXPECT: Some feelings of bloating in the abdomen. Passage of more gas than usual.  Walking can help get rid of the air that was put into your GI tract during the procedure and reduce the bloating. If you had a lower endoscopy (such as a colonoscopy or flexible sigmoidoscopy) you may notice spotting of blood in your stool or on the toilet paper. If you underwent a bowel prep for your procedure, you may not have a normal bowel movement for a few days.  Please Note:  You might notice some irritation and congestion in your nose or some drainage.  This is from the oxygen used during your procedure.  There is no need for concern and it should clear up in a day or so.  SYMPTOMS TO REPORT IMMEDIATELY:   Following lower endoscopy (colonoscopy or flexible sigmoidoscopy):  Excessive amounts of blood in the stool  Significant tenderness or worsening of abdominal pains  Swelling of the abdomen that is new, acute  Fever of 100F or higher    For urgent or emergent issues, a gastroenterologist can be reached at any hour by calling [phone]. Do not use MyChart messaging for urgent concerns.    DIET:  We do recommend a small meal at first, but then you may proceed to your regular diet.  Drink plenty of fluids but you should avoid alcoholic beverages for 24 hours.  ACTIVITY:   You should plan to take it easy for the rest of today and you should NOT DRIVE or use heavy machinery until tomorrow (because of the sedation medicines used during the test).    FOLLOW UP: Our staff will call the number listed on your records 48-72 hours following your procedure to check on you and address any questions or concerns that you may have regarding the information given to you following your procedure. If we do not reach you, we will leave a message.  We will attempt to reach you two times.  During this call, we will ask if you have developed any symptoms of COVID 19. If you develop any symptoms (ie: fever, flu-like symptoms, shortness of breath, cough etc.) before then, please call [phone].  If you test positive for Covid 19 in the 2 weeks post procedure, please call and report this information to us.    If any biopsies were taken you will be contacted by phone or by letter within the next 1-3 weeks.  Please call us at [phone] if you have not heard about the biopsies in 3 weeks.    SIGNATURES/CONFIDENTIALITY: You and/or your care partner have signed paperwork which will be entered into your electronic medical record.  These signatures attest to the fact that that the information above on your After Visit Summary has been reviewed and is understood.  Full responsibility of the confidentiality of this discharge information lies with you   and/or your care-partner. 

## 2021-02-05 NOTE — Progress Notes (Signed)
Pt's states no medical or surgical changes since previsit or office visit. 

## 2021-02-07 ENCOUNTER — Telehealth: Payer: Self-pay | Admitting: *Deleted

## 2021-02-07 ENCOUNTER — Telehealth: Payer: Self-pay

## 2021-02-07 NOTE — Telephone Encounter (Signed)
  Follow up Call-  Call back number 02/05/2021  Post procedure Call Back phone  # [phone]  Permission to leave phone message Yes  Some recent data might be hidden     No answer

## 2021-02-07 NOTE — Telephone Encounter (Signed)
Attempted 2nd f/u phone call. No answer. Left message.  °

## 2021-03-31 NOTE — Progress Notes (Signed)
Cardiology Office Note:    Date:  04/01/2021   ID:  Melissa Wyatt, DOB 10/06/1966, MRN [id number]  PCP:  Velna Hatchet, MD  [facility] Cardiologist:  Werner Lean, MD [facility] Electrophysiologist:  None   Referring MD: Velna Hatchet, MD   CC: Atrial Tachycardia f/u  History of Present Illness:    Melissa Wyatt is a 55 y.o. female with a hx of Morbid Obesity s/p gastric bypass, HTN with DM and HLD with DM who presents for evaluation seen 10/01/21.  In interim of this visit, patient had in palpitations and found to have SVT vs atrial tachycardia.  Started on BB with significant bradycardia (well tolerated).  Saw EP Quentin Ore) and was recommended to continue BB therapy.  Patient notes that she is doing great.  Since last visit notes that on new metoprolol feels great.  Started Ozembic.  Can get heart rate up to 100-110 with exercise.   No chest pain or pressure.  No SOB/DOE and no PND/Orthopnea.  No weight gain or leg swelling (slight intentional weight loss).  No palpitations or syncope.  Ambulatory blood pressure 130-60.   Past Medical History:  Diagnosis Date   Anxiety 06-26-20   grief from mother's death   COVID-19 10/26/2020   Diabetes mellitus ([place])    reversed after gastric bypass   HLD (hyperlipidemia)    Hypertension    Hypothyroidism     Past Surgical History:  Procedure Laterality Date   ABDOMINAL HYSTERECTOMY     GASTRIC BYPASS  12/2018   LAPAROSCOPIC GASTRIC BANDING  02/13/09   LAPAROSCOPIC ROUX-EN-Y GASTRIC BYPASS WITH UPPER ENDOSCOPY AND REMOVAL OF LAP BAND  12/2018   LAPAROSCOPIC ROUX-EN-Y GASTRIC BYPASS WITH UPPER ENDOSCOPY AND REMOVAL OF LAP BAND  12/2018   PLANTAR FASCIA SURGERY Bilateral    URETHRA SURGERY  1970    Current Medications: Current Meds  Medication Sig   ALPRAZolam (XANAX) 0.25 MG tablet take 1 tab by mouth bid prn as needed for anxiety   hydrochlorothiazide (HYDRODIURIL) 12.5 MG tablet Take 12.5 mg by  mouth daily.   levothyroxine (SYNTHROID, LEVOTHROID) 125 MCG tablet Take 125 mcg by mouth daily.   promethazine-dextromethorphan (PROMETHAZINE-DM) 6.25-15 MG/5ML syrup 5 ml as needed   rosuvastatin (CRESTOR) 10 MG tablet Take 1 tablet (10 mg total) by mouth at bedtime.   Semaglutide,0.25 or 0.5MG /DOS, (OZEMPIC, 0.25 OR 0.5 MG/DOSE,) 2 MG/1.5ML SOPN once a week.   VITAMIN D PO Take 5,000 Units by mouth.   [DISCONTINUED] lisinopril (ZESTRIL) 40 MG tablet Take 1 tablet (40 mg total) by mouth daily.   [DISCONTINUED] metoprolol tartrate (LOPRESSOR) 50 MG tablet Take 1 tablet (50 mg total) by mouth 2 (two) times daily.     Allergies:   Vicodin [hydrocodone-acetaminophen]   Social History   Socioeconomic History   Marital status: Married    Spouse name: Not on file   Number of children: 1   Years of education: Not on file   Highest education level: Not on file  Occupational History   Occupation: Engineer, mining  Tobacco Use   Smoking status: Never   Smokeless tobacco: Never  Vaping Use   Vaping Use: Never used  Substance and Sexual Activity   Alcohol use: No   Drug use: No   Sexual activity: Not on file  Other Topics Concern   Not on file  Social History Narrative   Not on file   Social Determinants of Health   Financial Resource Strain: Not on file  Food Insecurity: Not on file  Transportation Needs: Not on file  Physical Activity: Not on file  Stress: Not on file  Social Connections: Not on file    Social: Has Husband  Family History: [name] family history includes Atrial fibrillation in her mother; Cancer in her maternal grandfather and maternal uncle; Colon polyps in her father; Diabetes in her brother; Heart attack in her mother; Heart disease in her father and mother; Hyperlipidemia in her father and mother; Hypertension in her father and mother; Kidney failure in her mother; Lung cancer in her father. There is no history of Colon cancer, Esophageal cancer,  Rectal cancer, or Stomach cancer. History of coronary artery disease notable for mother and father. History of heart failure notable for mother. No history of cardiomyopathies including hypertrophic cardiomyopathy, left ventricular non-compaction, or arrhythmogenic right ventricular cardiomyopathy. History of arrhythmia notable for atrial fibrillation. Denies family history of sudden cardiac death including drowning, car accidents, or unexplained deaths in the family. No history of bicuspid aortic valve or aortic aneurysm or dissection.  ROS:   Please see the history of present illness.    All other systems reviewed and are negative.  EKGs/Labs/Other Studies Reviewed:    The following studies were reviewed today:  EKG:   10/01/20 Sinus Bradycardia Rate 45  Cardiac Event Monitoring: Date: 10/23/20 Results: Patient had a minimum heart rate of 42 bpm, maximum heart rate of 210 bpm, and average heart rate of 63 bpm. Predominant underlying rhythm was sinus rhythm. Forty-one runs of supraventricular tachycardia (SVT) occurred lasting 16.6 seconds at longest with a max rate of 210 bpm at fastest. Isolated PACs were rare (<1.0%), with rare couplets and triplets present. Isolated PVCs were rare (<1.0%). No evidence of complete heart block. Triggered and diary events associated with sinus rhythm, PACs, and SVT.   Symptomatic SVT.   Transthoracic Echocardiogram: Date: 06/08/2015 Results: Study Conclusions  - Procedure narrative: Transthoracic echocardiography. Image    quality was suboptimal. The study was technically difficult.    Intravenous contrast (Definity) was administered.  - Left ventricle: The cavity size was normal. Wall thickness was    increased in a pattern of mild LVH. Systolic function was normal.    The estimated ejection fraction was in the range of 60% to 65%.    Wall motion was normal; there were no regional wall motion    abnormalities. Doppler parameters are  consistent with abnormal    left ventricular relaxation (grade 1 diastolic dysfunction).   Cardiac CT : Date:06/25/2006 Results: No CAC CORONARY CTA:  Coronaries arose from the appropriate sinuses of Valsalva.    The left main coronary artery was normal.  The left anterior descending artery was normal.  There was one fairly large branching first diagonal branch.    The patient had a large left dominate coronary artery.  Circulation was normal.  The distal PDA was somewhat suboptimally visualized.  The right coronary artery was fairly small and nondominant with a single RV branch.  The patients calcium score was 0.   ECG or NM Stress Testing: Date: 06/18/2014 Results: The left ventricular ejection fraction is normal (55-65%). Nuclear stress EF: 63%. Blood pressure demonstrated a hypertensive response to exercise. Horizontal ST segment depression ST segment depression of 0.5 mm was noted during stress in the II, III and aVF leads, and returning to baseline after 1-5 minutes of recovery. No T wave inversion was noted during stress. This is a low risk study.   Low risk study. No  reversible ischemia. LVEF 63% with normal wall motion. Fair exercise tolerance with hypertensive BP response and occasional PVC's.   Recent Labs: 10/26/2020: Hemoglobin 14.4; Platelets 307.0 11/07/2020: BUN 17; Creatinine, Ser 0.60; Magnesium 2.3; Potassium 5.0; Sodium 139  Recent Lipid Panel No results found for: CHOL, TRIG, HDL, CHOLHDL, VLDL, LDLCALC, LDLDIRECT  [facility] LABS 08/29/2020 Total Cholesterol 210 HDL 46 TGs 185 LDL 120  12/26/19 Hgb 15 HCT 49 Platelet 287 Creatinine 0.7 AST/ALD 21/23  Risk Assessment/Calculations:     ASCVD 6.0 %  Physical Exam:    VS:  BP 130/62   Pulse (!) 54   Ht 5\' 1"  (1.549 m)   Wt 97.5 kg   SpO2 98%   BMI 40.62 kg/m     Wt Readings from Last 3 Encounters:  04/01/21 97.5 kg  02/05/21 97.5 kg  01/22/21 97.5 kg     DVV:OHYWV, well developed in no acute  distress HEENT: Normal NECK: No JVD; No carotid bruits LYMPHATICS: No lymphadenopathy CARDIAC: RRR (no bradycardia during auscultation), no murmurs, rubs, gallops RESPIRATORY:  Clear to auscultation without rales, wheezing or rhonchi  ABDOMEN: Soft, non-tender, non-distended MUSCULOSKELETAL:  No edema; No deformity  SKIN: Warm and dry NEUROLOGIC:  Alert and oriented x 3 PSYCHIATRIC:  Normal affect   ASSESSMENT:    1. Obesity, diabetes, and hypertension syndrome ([place])   2. Mixed hyperlipidemia   3. Atrial tachycardia ([place])   4. History of exposure to fenfluramine     PLAN:    In order of problems listed above:  Obesity (s/p prior gastric bypass)/DM/HTN HLD Paroxysmal SVT (Possible AT) Distant Fen-Phen use and dizziness (no PH on prior Echo) - Lopressor 50 mg PO BID (patient tolerate the bradycardia w/o sx and can augment BP - amb BP at goal - will follow up lipids and LFTs in three months (getting done with PC MD) - discussed diet options (focusing on drinks and salt/caloric content - continue HCTZ 12.5 mg PO Daily - will refill lisinopril 40 mg PO Daily - has started ozembic and liraglutide 03/13/21; will not change rosuvastatin at this time  One year follow up unless new symptoms or abnormal test results warranting change in plan   Medication Adjustments/Labs and Tests Ordered: Current medicines are reviewed at length with the patient today.  Concerns regarding medicines are outlined above.  No orders of the defined types were placed in this encounter.  Meds ordered this encounter  Medications   lisinopril (ZESTRIL) 40 MG tablet    Sig: Take 1 tablet (40 mg total) by mouth daily.    Dispense:  90 tablet    Refill:  3   metoprolol tartrate (LOPRESSOR) 50 MG tablet    Sig: Take 1 tablet (50 mg total) by mouth 2 (two) times daily.    Dispense:  180 tablet    Refill:  3     Patient Instructions  Medication Instructions:  Your physician recommends that you continue  on your current medications as directed. Please refer to the Current Medication list given to you today.  We have refilled your lisinopril and metoprolol  *If you need a refill on your cardiac medications before your next appointment, please call your pharmacy*   Lab Work: NONE If you have labs (blood work) drawn today and your tests are completely normal, you will receive your results only by: [place] (if you have MyChart) OR A paper copy in the mail If you have any lab test that is abnormal or we need to  change your treatment, we will call you to review the results.   Testing/Procedures: NONE   Follow-Up: At [facility], you and your health needs are our priority.  As part of our continuing mission to provide you with exceptional heart care, we have created designated Provider Care Teams.  These Care Teams include your primary Cardiologist (physician) and Advanced Practice Providers (APPs -  Physician Assistants and Nurse Practitioners) who all work together to provide you with the care you need, when you need it.  We recommend signing up for the patient portal called "MyChart".  Sign up information is provided on this After Visit Summary.  MyChart is used to connect with patients for Virtual Visits (Telemedicine).  Patients are able to view lab/test results, encounter notes, upcoming appointments, etc.  Non-urgent messages can be sent to your provider as well.   To learn more about what you can do with MyChart, go to [email or web address].    Your next appointment:   12 month(s)  The format for your next appointment:   In Person  Provider:   Rudean Haskell, MD       Signed, Werner Lean, MD  04/01/2021 8:31 AM    [place]

## 2021-04-01 ENCOUNTER — Ambulatory Visit (INDEPENDENT_AMBULATORY_CARE_PROVIDER_SITE_OTHER): Payer: Managed Care, Other (non HMO) | Admitting: Internal Medicine

## 2021-04-01 ENCOUNTER — Encounter: Payer: Self-pay | Admitting: Internal Medicine

## 2021-04-01 VITALS — BP 130/62 | HR 54 | Ht 61.0 in | Wt 215.0 lb

## 2021-04-01 DIAGNOSIS — I471 Supraventricular tachycardia: Secondary | ICD-10-CM

## 2021-04-01 DIAGNOSIS — I152 Hypertension secondary to endocrine disorders: Secondary | ICD-10-CM

## 2021-04-01 DIAGNOSIS — E1169 Type 2 diabetes mellitus with other specified complication: Secondary | ICD-10-CM

## 2021-04-01 DIAGNOSIS — E782 Mixed hyperlipidemia: Secondary | ICD-10-CM

## 2021-04-01 DIAGNOSIS — Z779 Other contact with and (suspected) exposures hazardous to health: Secondary | ICD-10-CM | POA: Diagnosis not present

## 2021-04-01 DIAGNOSIS — E669 Obesity, unspecified: Secondary | ICD-10-CM

## 2021-04-01 DIAGNOSIS — E1159 Type 2 diabetes mellitus with other circulatory complications: Secondary | ICD-10-CM

## 2021-04-01 MED ORDER — METOPROLOL TARTRATE 50 MG PO TABS: 50.0000 mg | ORAL_TABLET | Freq: Two times a day (BID) | ORAL | 3 refills | Status: DC

## 2021-04-01 MED ORDER — LISINOPRIL 40 MG PO TABS: 40.0000 mg | ORAL_TABLET | Freq: Every day | ORAL | 3 refills | Status: DC

## 2021-04-01 NOTE — Patient Instructions (Signed)
Medication Instructions:  Your physician recommends that you continue on your current medications as directed. Please refer to the Current Medication list given to you today.  We have refilled your lisinopril and metoprolol  *If you need a refill on your cardiac medications before your next appointment, please call your pharmacy*   Lab Work: NONE If you have labs (blood work) drawn today and your tests are completely normal, you will receive your results only by: [place] (if you have MyChart) OR A paper copy in the mail If you have any lab test that is abnormal or we need to change your treatment, we will call you to review the results.   Testing/Procedures: NONE   Follow-Up: At [facility], you and your health needs are our priority.  As part of our continuing mission to provide you with exceptional heart care, we have created designated Provider Care Teams.  These Care Teams include your primary Cardiologist (physician) and Advanced Practice Providers (APPs -  Physician Assistants and Nurse Practitioners) who all work together to provide you with the care you need, when you need it.  We recommend signing up for the patient portal called "MyChart".  Sign up information is provided on this After Visit Summary.  MyChart is used to connect with patients for Virtual Visits (Telemedicine).  Patients are able to view lab/test results, encounter notes, upcoming appointments, etc.  Non-urgent messages can be sent to your provider as well.   To learn more about what you can do with MyChart, go to [email or web address].    Your next appointment:   12 month(s)  The format for your next appointment:   In Person  Provider:   Rudean Haskell, MD

## 2021-08-08 ENCOUNTER — Other Ambulatory Visit: Payer: Self-pay | Admitting: Neurology

## 2021-08-08 MED ORDER — ROSUVASTATIN CALCIUM 10 MG PO TABS: 10.0000 mg | ORAL_TABLET | Freq: Every day | ORAL | 0 refills | Status: AC

## 2021-10-17 ENCOUNTER — Telehealth: Payer: Self-pay | Admitting: Internal Medicine

## 2021-10-17 DIAGNOSIS — Z79899 Other long term (current) drug therapy: Secondary | ICD-10-CM

## 2021-10-17 MED ORDER — METOPROLOL TARTRATE 50 MG PO TABS: 50.0000 mg | ORAL_TABLET | Freq: Two times a day (BID) | ORAL | 3 refills | Status: DC

## 2021-10-17 MED ORDER — HYDROCHLOROTHIAZIDE 25 MG PO TABS: 25.0000 mg | ORAL_TABLET | Freq: Every day | ORAL | 3 refills | Status: AC

## 2021-10-17 NOTE — Addendum Note (Signed)
Addended by: Levonne Hubert on: 10/17/2021 03:48 PM   Modules accepted: Orders

## 2021-10-17 NOTE — Telephone Encounter (Signed)
Pt reports feeling dizzy when changing positions in bed. She reports feeling dizzy on  Sunday morning while laying in bed, on Tuesday while laying on the table at the spa, and today while putting on make up. Current BP is 160/104 HR 59. She c/o Rt. Leg pain that started Sunday morning and woke her up from her sleep. Pt states that she may have pulled a muscle from crossing her legs on Saturday. Please advise.

## 2021-10-17 NOTE — Telephone Encounter (Signed)
STAT if patient feels like he/she is going to faint   Are you dizzy now? no  Do you feel faint or have you passed out? No I havent passed out. I only really can tell when laying down.  Do you have any other symptoms?  Pain in leg that is always there  Have you checked your HR and BP (record if available)? Heart rate shows 68. My BP was a little elevated at my last Doctors appt 12-27.. 140/90 range.     Message per pt schedules

## 2021-10-17 NOTE — Telephone Encounter (Signed)
Pt scheduled to have labs and BP check done on 10/31/21.

## 2021-10-31 ENCOUNTER — Ambulatory Visit: Payer: Managed Care, Other (non HMO)

## 2021-10-31 ENCOUNTER — Other Ambulatory Visit: Payer: Managed Care, Other (non HMO)

## 2022-06-29 ENCOUNTER — Other Ambulatory Visit: Payer: Self-pay | Admitting: Internal Medicine

## 2022-07-26 ENCOUNTER — Other Ambulatory Visit: Payer: Self-pay | Admitting: Internal Medicine

## 2022-07-31 ENCOUNTER — Encounter: Payer: Self-pay | Admitting: Internal Medicine

## 2022-07-31 ENCOUNTER — Ambulatory Visit: Payer: Managed Care, Other (non HMO) | Attending: Internal Medicine | Admitting: Internal Medicine

## 2022-07-31 VITALS — BP 126/66 | HR 56 | Ht 61.0 in | Wt 183.0 lb

## 2022-07-31 DIAGNOSIS — I471 Supraventricular tachycardia, unspecified: Secondary | ICD-10-CM

## 2022-07-31 DIAGNOSIS — I152 Hypertension secondary to endocrine disorders: Secondary | ICD-10-CM

## 2022-07-31 DIAGNOSIS — I4719 Other supraventricular tachycardia: Secondary | ICD-10-CM | POA: Diagnosis not present

## 2022-07-31 DIAGNOSIS — E1169 Type 2 diabetes mellitus with other specified complication: Secondary | ICD-10-CM | POA: Diagnosis not present

## 2022-07-31 DIAGNOSIS — E669 Obesity, unspecified: Secondary | ICD-10-CM

## 2022-07-31 DIAGNOSIS — E782 Mixed hyperlipidemia: Secondary | ICD-10-CM

## 2022-07-31 DIAGNOSIS — E1159 Type 2 diabetes mellitus with other circulatory complications: Secondary | ICD-10-CM

## 2022-07-31 DIAGNOSIS — Z779 Other contact with and (suspected) exposures hazardous to health: Secondary | ICD-10-CM

## 2022-07-31 NOTE — Patient Instructions (Signed)
Medication Instructions:  Your physician recommends that you continue on your current medications as directed. Please refer to the Current Medication list given to you today.  *If you need a refill on your cardiac medications before your next appointment, please call your pharmacy*   Lab Work: NONE If you have labs (blood work) drawn today and your tests are completely normal, you will receive your results only by: [place] (if you have MyChart) OR A paper copy in the mail If you have any lab test that is abnormal or we need to change your treatment, we will call you to review the results.   Testing/Procedures: NONE   Follow-Up: At [facility], you and your health needs are our priority.  As part of our continuing mission to provide you with exceptional heart care, we have created designated Provider Care Teams.  These Care Teams include your primary Cardiologist (physician) and Advanced Practice Providers (APPs -  Physician Assistants and Nurse Practitioners) who all work together to provide you with the care you need, when you need it.   Your next appointment:   1 year(s)  The format for your next appointment:   In Person  Provider:   Werner Lean, MD     Important Information About Sugar

## 2022-07-31 NOTE — Progress Notes (Signed)
Cardiology Office Note:    Date:  07/31/2022   ID:  Melissa Wyatt, DOB Nov 25, 1965, MRN [id number]  PCP:  Velna Hatchet, MD  [facility] Cardiologist:  Werner Lean, MD [place] Electrophysiologist:  None   Referring MD: Velna Hatchet, MD   CC: Atrial Tachycardia f/u  History of Present Illness:    Melissa Wyatt is a 56 y.o. female with a hx of Morbid Obesity s/p gastric bypass, HTN with DM and HLD with DM who presents for evaluation seen 10/01/21.   2022: In interim of this visit, patient had in palpitations and found to have SVT vs atrial tachycardia.  Started on BB with significant bradycardia (well tolerated).  Saw EP Quentin Ore) and was recommended to continue BB therapy.  Started GLP-1 therapy  Patient notes that she is doing well.   Since last visit notes that she  . There are no interval hospital/ED visit.    No chest pain or pressure .  No SOB/DOE and no PND/Orthopnea.  No weight gain or leg swelling (has intentional weight loss).  Very rare palpitations.  Walks with purpose and is mindful of diet.  Ambulatory blood pressure 126/70.  Past Medical History:  Diagnosis Date   Anxiety 07/17/20   grief from mother's death   COVID-19 November 16, 2020   Diabetes mellitus ([place])    reversed after gastric bypass   HLD (hyperlipidemia)    Hypertension    Hypothyroidism     Past Surgical History:  Procedure Laterality Date   ABDOMINAL HYSTERECTOMY     GASTRIC BYPASS  12/2018   LAPAROSCOPIC GASTRIC BANDING  02/13/09   LAPAROSCOPIC ROUX-EN-Y GASTRIC BYPASS WITH UPPER ENDOSCOPY AND REMOVAL OF LAP BAND  12/2018   LAPAROSCOPIC ROUX-EN-Y GASTRIC BYPASS WITH UPPER ENDOSCOPY AND REMOVAL OF LAP BAND  12/2018   PLANTAR FASCIA SURGERY Bilateral    URETHRA SURGERY  1970    Current Medications: Current Meds  Medication Sig   ALPRAZolam (XANAX) 0.25 MG tablet take 1 tab by mouth bid prn as needed for anxiety   hydrochlorothiazide (HYDRODIURIL) 25 MG tablet  Take 1 tablet (25 mg total) by mouth daily.   levothyroxine (SYNTHROID, LEVOTHROID) 125 MCG tablet Take 125 mcg by mouth daily.   lisinopril (ZESTRIL) 40 MG tablet Take 1 tablet (40 mg total) by mouth daily.   metoprolol tartrate (LOPRESSOR) 50 MG tablet TAKE 1 TABLET BY MOUTH TWICE A DAY   promethazine-dextromethorphan (PROMETHAZINE-DM) 6.25-15 MG/5ML syrup 5 ml as needed   rosuvastatin (CRESTOR) 10 MG tablet Take 1 tablet (10 mg total) by mouth at bedtime.   Semaglutide,0.25 or 0.'5MG'$ /DOS, (OZEMPIC, 0.25 OR 0.5 MG/DOSE,) 2 MG/1.5ML SOPN once a week.   VITAMIN D PO Take 5,000 Units by mouth.     Allergies:   Vicodin [hydrocodone-acetaminophen]   Social History   Socioeconomic History   Marital status: Married    Spouse name: Not on file   Number of children: 1   Years of education: Not on file   Highest education level: Not on file  Occupational History   Occupation: Engineer, mining  Tobacco Use   Smoking status: Never   Smokeless tobacco: Never  Vaping Use   Vaping Use: Never used  Substance and Sexual Activity   Alcohol use: No   Drug use: No   Sexual activity: Not on file  Other Topics Concern   Not on file  Social History Narrative   Not on file   Social Determinants of Health   Financial Resource  Strain: Not on file  Food Insecurity: Not on file  Transportation Needs: Not on file  Physical Activity: Not on file  Stress: Not on file  Social Connections: Not on file    Social: Has Husband  Family History: [name] family history includes Atrial fibrillation in her mother; Cancer in her maternal grandfather and maternal uncle; Colon polyps in her father; Diabetes in her brother; Heart attack in her mother; Heart disease in her father and mother; Hyperlipidemia in her father and mother; Hypertension in her father and mother; Kidney failure in her mother; Lung cancer in her father. There is no history of Colon cancer, Esophageal cancer, Rectal cancer, or  Stomach cancer. History of coronary artery disease notable for mother and father. History of heart failure notable for mother. No history of cardiomyopathies including hypertrophic cardiomyopathy, left ventricular non-compaction, or arrhythmogenic right ventricular cardiomyopathy. History of arrhythmia notable for atrial fibrillation. Denies family history of sudden cardiac death including drowning, car accidents, or unexplained deaths in the family. No history of bicuspid aortic valve or aortic aneurysm or dissection.  ROS:   Please see the history of present illness.    All other systems reviewed and are negative.  EKGs/Labs/Other Studies Reviewed:    The following studies were reviewed today:  EKG:   07/31/22: Sinus bradycardia 56 10/01/20 Sinus Bradycardia Rate 45  Cardiac Event Monitoring: Date: 10/23/20 Results: Patient had a minimum heart rate of 42 bpm, maximum heart rate of 210 bpm, and average heart rate of 63 bpm. Predominant underlying rhythm was sinus rhythm. Forty-one runs of supraventricular tachycardia (SVT) occurred lasting 16.6 seconds at longest with a max rate of 210 bpm at fastest. Isolated PACs were rare (<1.0%), with rare couplets and triplets present. Isolated PVCs were rare (<1.0%). No evidence of complete heart block. Triggered and diary events associated with sinus rhythm, PACs, and SVT.   Symptomatic SVT.   Transthoracic Echocardiogram: Date: 06/08/2015 Results: Study Conclusions  - Procedure narrative: Transthoracic echocardiography. Image    quality was suboptimal. The study was technically difficult.    Intravenous contrast (Definity) was administered.  - Left ventricle: The cavity size was normal. Wall thickness was    increased in a pattern of mild LVH. Systolic function was normal.    The estimated ejection fraction was in the range of 60% to 65%.    Wall motion was normal; there were no regional wall motion    abnormalities. Doppler  parameters are consistent with abnormal    left ventricular relaxation (grade 1 diastolic dysfunction).   Cardiac CT : Date:06/25/2006 Results: No CAC CORONARY CTA:  Coronaries arose from the appropriate sinuses of Valsalva.    The left main coronary artery was normal.  The left anterior descending artery was normal.  There was one fairly large branching first diagonal branch.    The patient had a large left dominate coronary artery.  Circulation was normal.  The distal PDA was somewhat suboptimally visualized.  The right coronary artery was fairly small and nondominant with a single RV branch.  The patients calcium score was 0.   ECG or NM Stress Testing: Date: 06/18/2014 Results: The left ventricular ejection fraction is normal (55-65%). Nuclear stress EF: 63%. Blood pressure demonstrated a hypertensive response to exercise. Horizontal ST segment depression ST segment depression of 0.5 mm was noted during stress in the II, III and aVF leads, and returning to baseline after 1-5 minutes of recovery. No T wave inversion was noted during stress. This is a  low risk study.   Low risk study. No reversible ischemia. LVEF 63% with normal wall motion. Fair exercise tolerance with hypertensive BP response and occasional PVC's.   Recent Labs: No results found for requested labs within last 365 days.  Recent Lipid Panel No results found for: "CHOL", "TRIG", "HDL", "CHOLHDL", "VLDL", "LDLCALC", "LDLDIRECT"   Physical Exam:    VS:  BP 126/66   Pulse (!) 56   Ht '5\' 1"'$  (1.549 m)   Wt 183 lb (83 kg)   SpO2 99%   BMI 34.58 kg/m     Wt Readings from Last 3 Encounters:  07/31/22 183 lb (83 kg)  04/01/21 215 lb (97.5 kg)  02/05/21 215 lb (97.5 kg)     WFU:XNATF, well developed in no acute distress HEENT: Normal NECK: No JVD LYMPHATICS: No lymphadenopathy CARDIAC: Regular bradycardia, no murmurs, rubs, gallops RESPIRATORY:  Clear to auscultation without rales, wheezing or rhonchi   ABDOMEN: Soft, non-tender, non-distended MUSCULOSKELETAL:  No edema; No deformity  SKIN: Warm and dry NEUROLOGIC:  Alert and oriented x 3 PSYCHIATRIC:  Normal affect   ASSESSMENT:    1. SVT (supraventricular tachycardia)   2. Atrial tachycardia   3. Mixed hyperlipidemia   4. Obesity, diabetes, and hypertension syndrome ([place])   5. History of exposure to fenfluramine     PLAN:    Atrial tachycardia  - continue BB  Obesity (s/p prior gastric bypass) DM/HTN HLD Distant Fen-Phen use and dizziness (no PH on prior Echo) - amb BP at goal, repeat BP normal - LDL has improved - continue HCTZ 12.5 mg PO Daily - lisinopril 40 mg PO Daily - we discussed pickle ball 30 minutes; and weight training given her GLP-1, her goal is 150 minutes of cardiovascular exercise and two sessions of weight training; her gym is at her work  One year me or APP   Medication Adjustments/Labs and Tests Ordered: Current medicines are reviewed at length with the patient today.  Concerns regarding medicines are outlined above.  Orders Placed This Encounter  Procedures   EKG 12-Lead    No orders of the defined types were placed in this encounter.    Patient Instructions  Medication Instructions:  Your physician recommends that you continue on your current medications as directed. Please refer to the Current Medication list given to you today.  *If you need a refill on your cardiac medications before your next appointment, please call your pharmacy*   Lab Work: NONE If you have labs (blood work) drawn today and your tests are completely normal, you will receive your results only by: [place] (if you have MyChart) OR A paper copy in the mail If you have any lab test that is abnormal or we need to change your treatment, we will call you to review the results.   Testing/Procedures: NONE   Follow-Up: At [facility], you and your health needs are our priority.  As part of our  continuing mission to provide you with exceptional heart care, we have created designated Provider Care Teams.  These Care Teams include your primary Cardiologist (physician) and Advanced Practice Providers (APPs -  Physician Assistants and Nurse Practitioners) who all work together to provide you with the care you need, when you need it.   Your next appointment:   1 year(s)  The format for your next appointment:   In Person  Provider:   Werner Lean, MD     Important Information About Sugar  Signed, Werner Lean, MD  07/31/2022 11:22 AM    [facility]

## 2022-08-11 ENCOUNTER — Other Ambulatory Visit: Payer: Self-pay | Admitting: Internal Medicine

## 2022-09-09 ENCOUNTER — Other Ambulatory Visit: Payer: Self-pay | Admitting: Internal Medicine

## 2022-09-09 NOTE — Telephone Encounter (Signed)
Rx refill sent to pharmacy. 

## 2023-05-17 ENCOUNTER — Other Ambulatory Visit: Payer: Self-pay | Admitting: Internal Medicine

## 2023-11-05 ENCOUNTER — Other Ambulatory Visit: Payer: Self-pay | Admitting: Internal Medicine

## 2023-12-07 ENCOUNTER — Other Ambulatory Visit: Payer: Self-pay | Admitting: Internal Medicine

## 2023-12-24 ENCOUNTER — Other Ambulatory Visit: Payer: Self-pay | Admitting: Internal Medicine

## 2023-12-24 MED ORDER — METOPROLOL TARTRATE 50 MG PO TABS: 50.0000 mg | ORAL_TABLET | Freq: Two times a day (BID) | ORAL | 0 refills | Status: AC

## 2023-12-24 NOTE — Addendum Note (Signed)
 Addended by: Mariam Dollar on: 12/24/2023 12:14 PM   Modules accepted: Orders

## 2024-06-30 ENCOUNTER — Encounter: Payer: Self-pay | Admitting: Gastroenterology
